# Patient Record
Sex: Female | Born: 1990 | Race: White | Hispanic: No | Smoking: Never smoker
Health system: Southern US, Community
[De-identification: ages and names within clinical notes are randomized; demographics above are authoritative.]

## PROBLEM LIST (undated history)

## (undated) DIAGNOSIS — F4310 Post-traumatic stress disorder, unspecified: Secondary | ICD-10-CM

## (undated) DIAGNOSIS — F429 Obsessive-compulsive disorder, unspecified: Secondary | ICD-10-CM

## (undated) DIAGNOSIS — J45909 Unspecified asthma, uncomplicated: Secondary | ICD-10-CM

## (undated) DIAGNOSIS — R51 Headache: Secondary | ICD-10-CM

## (undated) DIAGNOSIS — F913 Oppositional defiant disorder: Secondary | ICD-10-CM

## (undated) DIAGNOSIS — Z8659 Personal history of other mental and behavioral disorders: Secondary | ICD-10-CM

## (undated) DIAGNOSIS — F32A Depression, unspecified: Secondary | ICD-10-CM

## (undated) DIAGNOSIS — F29 Unspecified psychosis not due to a substance or known physiological condition: Secondary | ICD-10-CM

## (undated) DIAGNOSIS — F319 Bipolar disorder, unspecified: Secondary | ICD-10-CM

## (undated) DIAGNOSIS — N920 Excessive and frequent menstruation with regular cycle: Secondary | ICD-10-CM

## (undated) DIAGNOSIS — F329 Major depressive disorder, single episode, unspecified: Secondary | ICD-10-CM

## (undated) DIAGNOSIS — I499 Cardiac arrhythmia, unspecified: Secondary | ICD-10-CM

## (undated) DIAGNOSIS — F419 Anxiety disorder, unspecified: Secondary | ICD-10-CM

## (undated) DIAGNOSIS — G43109 Migraine with aura, not intractable, without status migrainosus: Secondary | ICD-10-CM

## (undated) HISTORY — DX: Excessive and frequent menstruation with regular cycle: N92.0

## (undated) HISTORY — DX: Major depressive disorder, single episode, unspecified: F32.9

## (undated) HISTORY — DX: Anxiety disorder, unspecified: F41.9

## (undated) HISTORY — DX: Post-traumatic stress disorder, unspecified: F43.10

## (undated) HISTORY — DX: Personal history of other mental and behavioral disorders: Z86.59

## (undated) HISTORY — DX: Migraine with aura, not intractable, without status migrainosus: G43.109

## (undated) HISTORY — DX: Headache: R51

## (undated) HISTORY — DX: Depression, unspecified: F32.A

## (undated) HISTORY — DX: Unspecified psychosis not due to a substance or known physiological condition: F29

## (undated) HISTORY — DX: Cardiac arrhythmia, unspecified: I49.9

## (undated) HISTORY — DX: Obsessive-compulsive disorder, unspecified: F42.9

## (undated) HISTORY — DX: Unspecified asthma, uncomplicated: J45.909

## (undated) HISTORY — DX: Bipolar disorder, unspecified: F31.9

## (undated) HISTORY — DX: Oppositional defiant disorder: F91.3

---
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
## 2009-07-27 ENCOUNTER — Ambulatory Visit (HOSPITAL_COMMUNITY): Admission: RE | Admit: 2009-07-27 | Discharge: 2009-07-27 | Payer: Self-pay | Admitting: Internal Medicine

## 2009-08-25 ENCOUNTER — Emergency Department (HOSPITAL_COMMUNITY): Admission: EM | Admit: 2009-08-25 | Discharge: 2009-08-25 | Payer: Self-pay | Admitting: Emergency Medicine

## 2010-11-29 ENCOUNTER — Other Ambulatory Visit (HOSPITAL_COMMUNITY): Payer: Self-pay | Admitting: Internal Medicine

## 2010-11-29 ENCOUNTER — Ambulatory Visit (HOSPITAL_COMMUNITY)
Admission: RE | Admit: 2010-11-29 | Discharge: 2010-11-29 | Disposition: A | Discharge status: Home / Self Care | Payer: Managed Care, Other (non HMO) | Source: Ambulatory Visit | Attending: Internal Medicine | Admitting: Internal Medicine

## 2010-11-29 DIAGNOSIS — M545 Low back pain, unspecified: Secondary | ICD-10-CM

## 2012-01-30 IMAGING — CR DG LUMBAR SPINE COMPLETE 4+V
5 series · 5 of 5 positions shown · non-contrast
Comparison: None.

CLINICAL DATA: Worsening low back pain.

LUMBAR SPINE - COMPLETE 4+ VIEW

[view not recorded (1 of 5)]
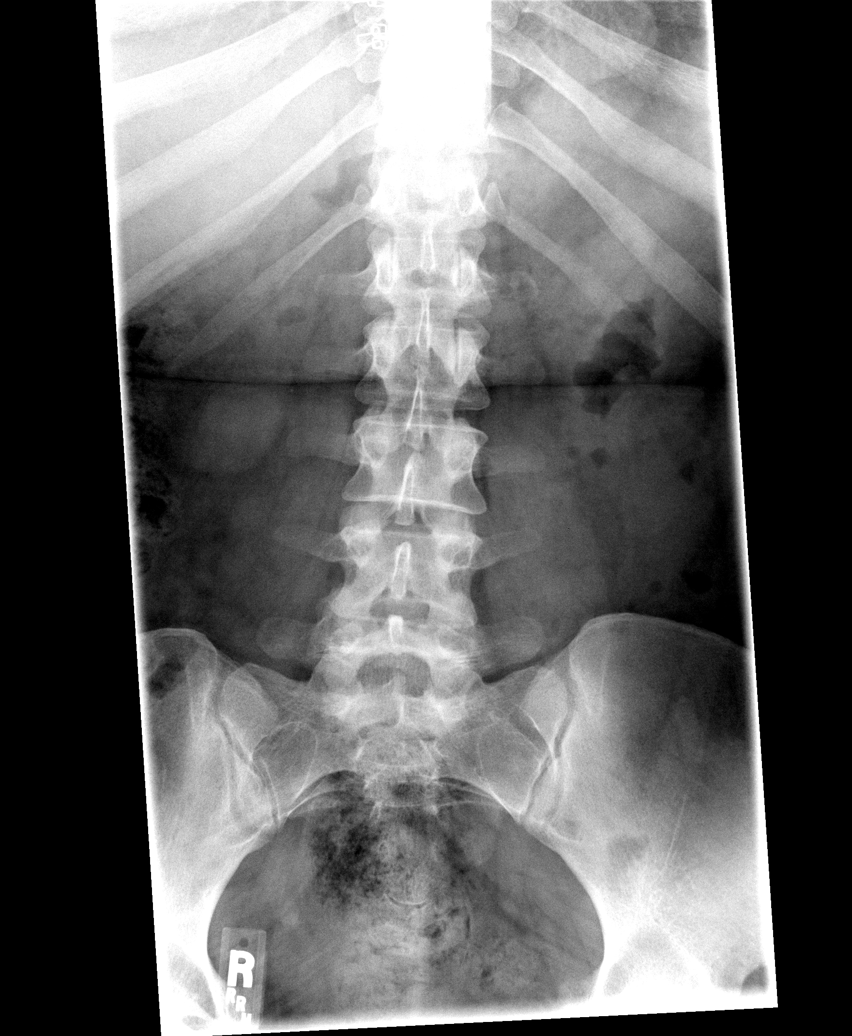

[view not recorded (2 of 5)]
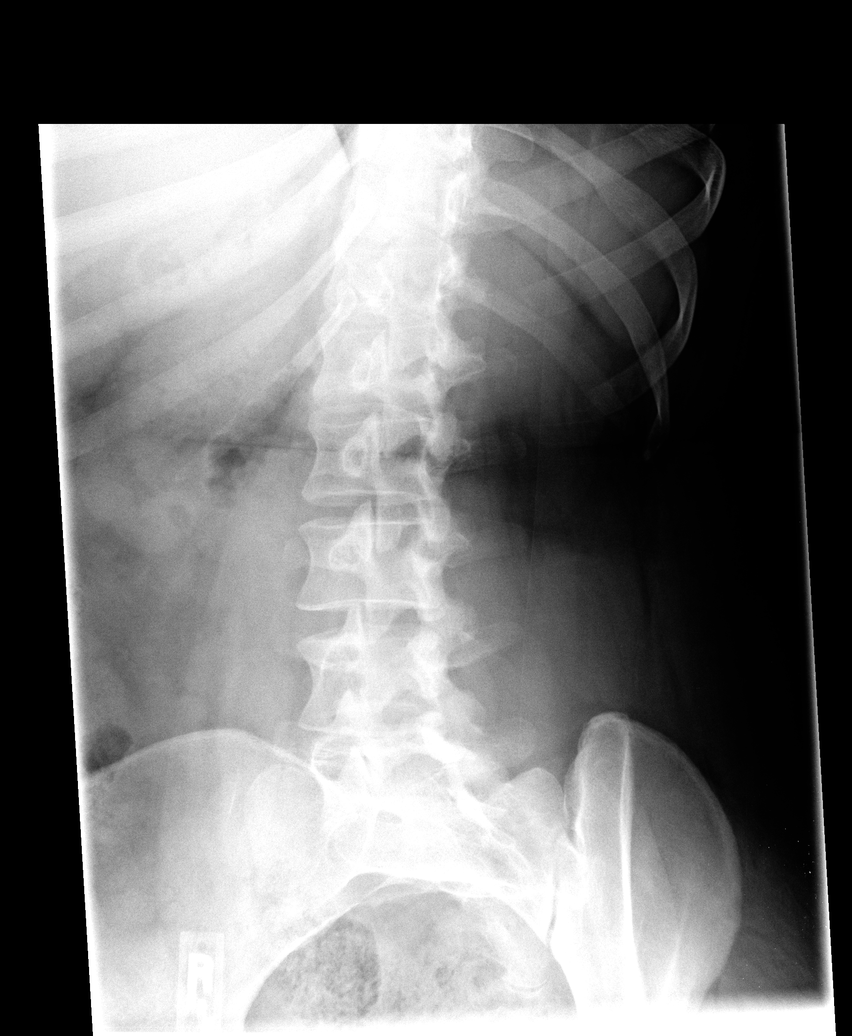

[view not recorded (3 of 5)]
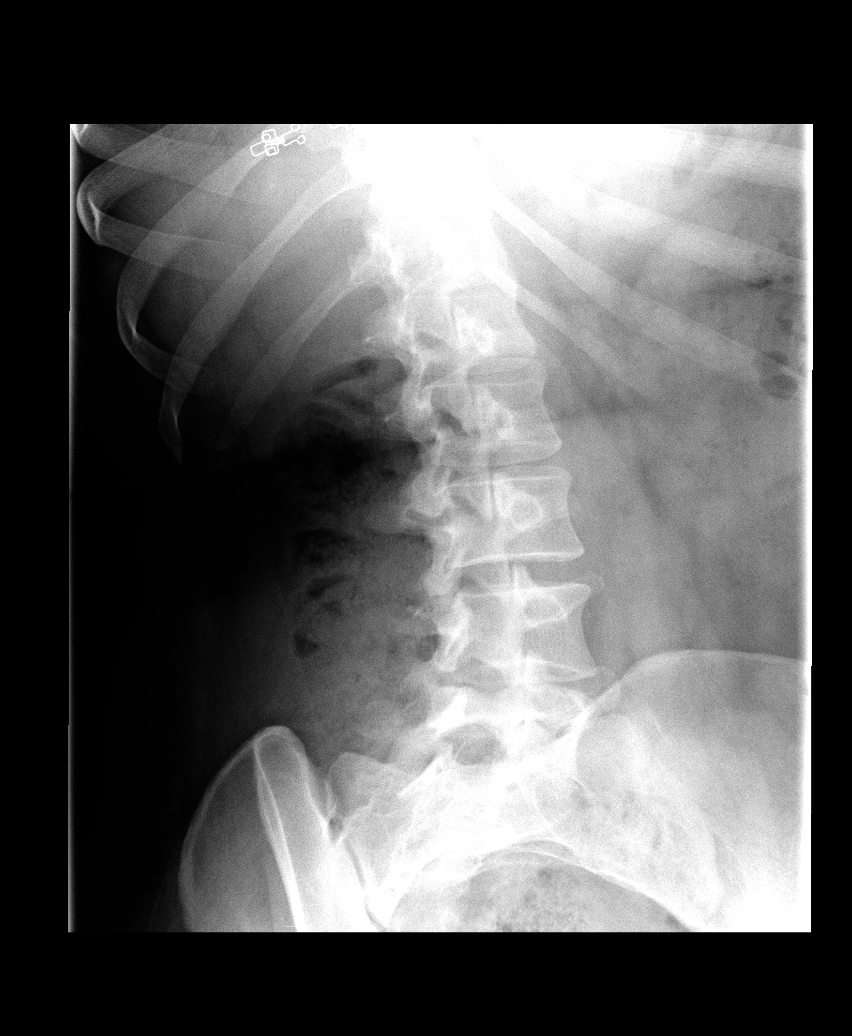

[view not recorded (4 of 5)]
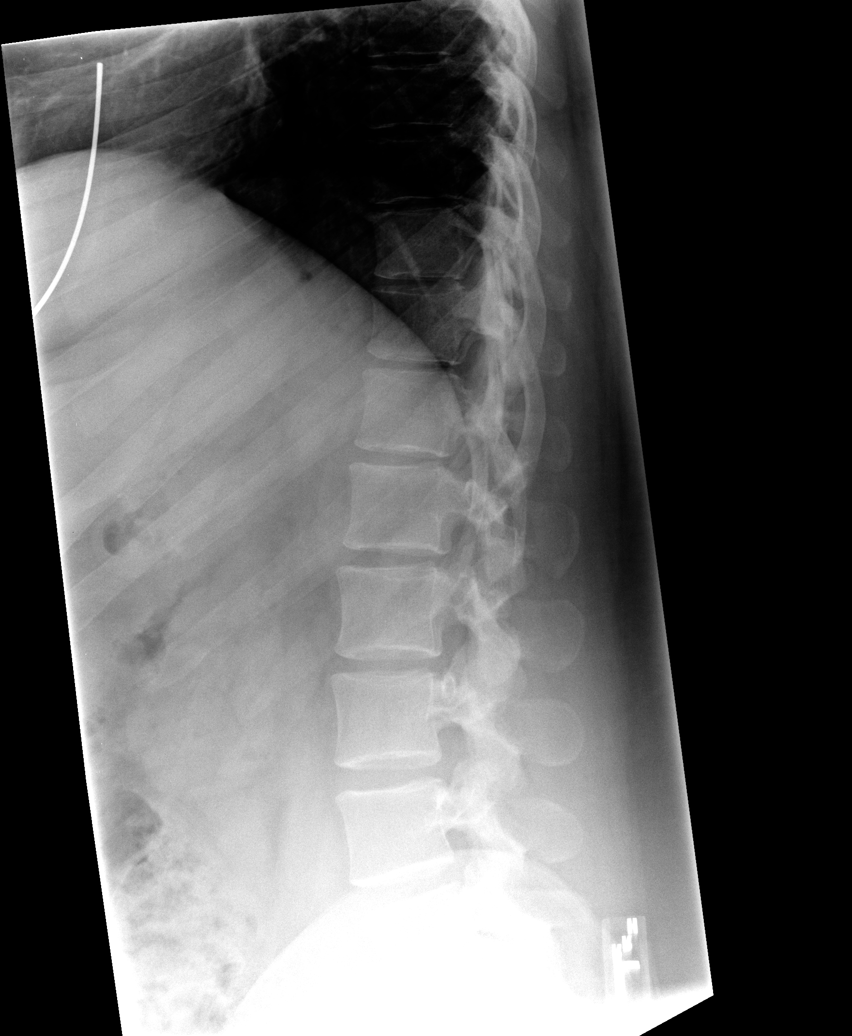

[view not recorded (5 of 5)]
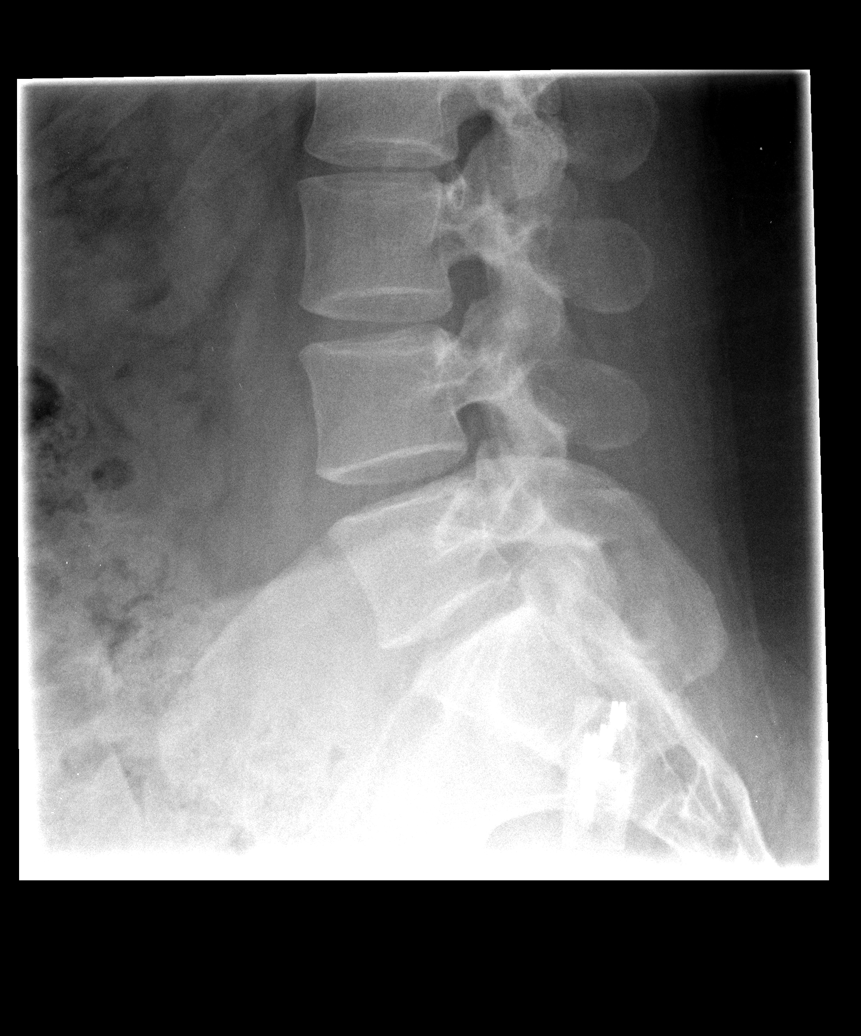

[5 of 5 positions shown; findings below may reference images not displayed]

FINDINGS: There is no evidence of lumbar spine fracture.
Alignment is normal.  Intervertebral disc spaces are maintained.
IMPRESSION: Negative.

## 2012-11-16 ENCOUNTER — Ambulatory Visit (INDEPENDENT_AMBULATORY_CARE_PROVIDER_SITE_OTHER): Payer: Managed Care, Other (non HMO) | Admitting: Psychiatry

## 2012-11-16 ENCOUNTER — Encounter (HOSPITAL_COMMUNITY): Payer: Self-pay | Admitting: Psychiatry

## 2012-11-16 VITALS — Wt 241.2 lb

## 2012-11-16 DIAGNOSIS — F410 Panic disorder [episodic paroxysmal anxiety] without agoraphobia: Secondary | ICD-10-CM

## 2012-11-16 DIAGNOSIS — F314 Bipolar disorder, current episode depressed, severe, without psychotic features: Secondary | ICD-10-CM | POA: Insufficient documentation

## 2012-11-16 DIAGNOSIS — F411 Generalized anxiety disorder: Secondary | ICD-10-CM

## 2012-11-16 DIAGNOSIS — F3130 Bipolar disorder, current episode depressed, mild or moderate severity, unspecified: Secondary | ICD-10-CM

## 2012-11-16 DIAGNOSIS — F4310 Post-traumatic stress disorder, unspecified: Secondary | ICD-10-CM

## 2012-11-16 DIAGNOSIS — F429 Obsessive-compulsive disorder, unspecified: Secondary | ICD-10-CM

## 2012-11-16 NOTE — Patient Instructions (Addendum)
"  The [place] to [place]" compiled by [employer] could be helpful.  Strongly consider attending at least 6 Alanon Meetings to help you learn about how your helping others to the exclusion of helping yourself is actually hurting yourself and is actually an addiction to fixing others and that you need to work the 12 Step to Happiness through the [employer]. Al-Anon Family Groups could be helpful with how to deal with substance abusing family and friends. Or your own issues of being in victim role.  There are only 40 Alanon Family Group meetings a week here in [employer].  Online are current listing of those meetings @ [email or web address]  There are [employer].  Search on line and there you can learn the format and can access the schedule for yourself.  Their number is [phone]  Adult children of alcoholics has some amazing literature available on line.  The work book is awesome  Yoga is a very helpful exercise method.  On TV, on line, or by DVD Adelfa Koh is a source of high quality information about yoga and videos on yoga.  Renee Ramus is the world's number one video yoga instructor according to some experts.  There are exceptional health benefits that can be achieved through yoga.  The main principles of yoga is acceptance, no competition, no comparison, and no judgement.  It is exceptional in helping people meditate and get to a very relaxed state.   "I am Wishes Fulfilled Meditation" by Marylene Buerger and Lyndal Pulley may be helpful for meditation  CUT BACK/CUT OUT on sugar and carbohydrates, that means very limited fruits and starchy vegetables and very limited grains, breads  The goal is low GLYCEMIC INDEX.  CUT OUT all wheat, rye, or barley for the GLUTEN in them.  HIGH fat and LOW carbohydrate diet is the KEY.  Eat avocados, eggs, lean meat like grass fed beef and chicken  Nuts and seeds would be good foods as well.   Stevia is an excellent  sweetener.  Safe for the brain.   Lowella Grip is also a good safe sweetener, not the baking blend form of Truvia  Almond butter is awesome.  Check out all this on the Internet.  Dr Heber Vieques is on the Internet with some good info about this.   Http://www.drperlmutter.com is where that is.  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 These 3 things are the foundation of rehabilitating your brain. Staying off all abusable substances including nicotine, caffeine, and refined sugar and avoiding further head injuries are the other important elements in helping you keep your brain working the best it can for you. If memory is a problem then INSTEAD of the fish oil mentioned above, try using Brain Power Basics from [employer].  You can order online or by phone [phone]. It costs $99 for the first month, and $80 monthly thereafter, but that investment in your brain and the recovery of your brain proper functioning would seem worth it.  Call if problems or concerns.
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
## 2012-11-17 ENCOUNTER — Other Ambulatory Visit: Payer: Self-pay | Admitting: Adult Health

## 2012-11-17 ENCOUNTER — Telehealth (HOSPITAL_COMMUNITY): Payer: Self-pay | Admitting: Psychiatry

## 2012-11-17 ENCOUNTER — Other Ambulatory Visit (HOSPITAL_COMMUNITY)
Admission: RE | Admit: 2012-11-17 | Discharge: 2012-11-17 | Disposition: A | Discharge status: Home / Self Care | Payer: Managed Care, Other (non HMO) | Source: Ambulatory Visit | Attending: Obstetrics and Gynecology | Admitting: Obstetrics and Gynecology

## 2012-11-17 ENCOUNTER — Ambulatory Visit (INDEPENDENT_AMBULATORY_CARE_PROVIDER_SITE_OTHER): Payer: Managed Care, Other (non HMO) | Admitting: Adult Health

## 2012-11-17 ENCOUNTER — Encounter: Payer: Self-pay | Admitting: Adult Health

## 2012-11-17 VITALS — BP 118/80 | Ht 60.0 in | Wt 241.0 lb

## 2012-11-17 DIAGNOSIS — Z0000 Encounter for general adult medical examination without abnormal findings: Secondary | ICD-10-CM

## 2012-11-17 DIAGNOSIS — R51 Headache: Secondary | ICD-10-CM

## 2012-11-17 DIAGNOSIS — F410 Panic disorder [episodic paroxysmal anxiety] without agoraphobia: Secondary | ICD-10-CM

## 2012-11-17 DIAGNOSIS — Z01419 Encounter for gynecological examination (general) (routine) without abnormal findings: Secondary | ICD-10-CM | POA: Insufficient documentation

## 2012-11-17 DIAGNOSIS — F314 Bipolar disorder, current episode depressed, severe, without psychotic features: Secondary | ICD-10-CM

## 2012-11-17 DIAGNOSIS — N946 Dysmenorrhea, unspecified: Secondary | ICD-10-CM | POA: Insufficient documentation

## 2012-11-17 DIAGNOSIS — G43109 Migraine with aura, not intractable, without status migrainosus: Secondary | ICD-10-CM

## 2012-11-17 DIAGNOSIS — N920 Excessive and frequent menstruation with regular cycle: Secondary | ICD-10-CM | POA: Insufficient documentation

## 2012-11-17 DIAGNOSIS — F4310 Post-traumatic stress disorder, unspecified: Secondary | ICD-10-CM

## 2012-11-17 DIAGNOSIS — F5105 Insomnia due to other mental disorder: Secondary | ICD-10-CM | POA: Insufficient documentation

## 2012-11-17 HISTORY — DX: Migraine with aura, not intractable, without status migrainosus: G43.109

## 2012-11-17 MED ORDER — NAPROXEN 500 MG PO TABS: 500.0000 mg | ORAL_TABLET | Freq: Two times a day (BID) | ORAL | Status: DC | PRN

## 2012-11-17 MED ORDER — QUETIAPINE FUMARATE 25 MG PO TABS: 25.0000 mg | ORAL_TABLET | Freq: Every day | ORAL | Status: DC

## 2012-11-17 MED ORDER — LITHIUM CARBONATE 600 MG PO CAPS: 600.0000 mg | ORAL_CAPSULE | Freq: Every day | ORAL | Status: DC

## 2012-11-17 MED ORDER — LITHIUM CARBONATE 300 MG PO TABS: 300.0000 mg | ORAL_TABLET | ORAL | Status: DC

## 2012-11-17 MED ORDER — SERTRALINE HCL 50 MG PO TABS: 50.0000 mg | ORAL_TABLET | Freq: Every day | ORAL | Status: DC

## 2012-11-17 MED ORDER — NORETHINDRONE 0.35 MG PO TABS: 1.0000 | ORAL_TABLET | Freq: Every day | ORAL | Status: DC

## 2012-11-17 NOTE — Progress Notes (Signed)
Subjective:     Patient ID: Paula Kane, female   DOB: Sep 01, 1991, 22 y.o.   MRN: [id number]  HPI [name] is a 22 year old white female in today for Pap and physical.   Review of Systems patient is complaining of menstrual-type headaches she does have auras and no shortness of breath, chest pain, abdominal pain, problems with bowel movements, urination and she is currently not sexually active she is complaining of heavy painful periods and would like to get on some form of birth control.     Objective:   Physical Exam vital signs blood pressure is 118/80 her pulse was 78 and regular her height is 5 foot her weight is 241lbs. skin is warm and dry, neck midline trachea normal thyroid, lungs clear to auscultation bilaterally, breasts no dominant palpable mass retraction or nipple discharge, abdomen soft nontender no hepatosplenomegaly was appreciated she is morbidly obese, on pelvic exam external genitalia is normal in appearance, the vagina is normal appearance except she does have some period-type blood in the vault the cervix is nulliparous, negative cervical motion tenderness uterus is felt to be normal size shape and contour no adnexal masses noted she does have some tenderness in the right lower quadrant. It is noteworthy she does have some acne on her face chest and back and appears to be some hidradenitis under the breast and groin area. Pap smear was performed with a reflex HPV.     Assessment:     Yearly exam, contraceptive management, menorrhagia, dysmenorrhea, history of bipolar disorder and depression    Plan:     The patient was prescribed Micronor which she can start to day, she is encouraged to use condoms if she becomes sexually active, also she was given a prescription for naproxen to see if it helps with the dysmenorrhea will followup in the year for physical when necessary problems.

## 2012-11-17 NOTE — Patient Instructions (Addendum)
Follow up in 1 year, start birth control pills today, if has sex use condoms, call any problems.

## 2012-11-17 NOTE — Telephone Encounter (Signed)
Nicolette Bang indicated to the patient that they did not receive the eScript orders.  Called [facility] and placed on hold for more than the time it took to rewrite and resend scripts.  Scripts sent again by eScript. Sent a bogus script for 0.00003 pills of Lithium and requested that they fax confirmation of receipt of 4 scripts.

## 2012-11-18 ENCOUNTER — Telehealth (HOSPITAL_COMMUNITY): Payer: Self-pay | Admitting: Psychiatry

## 2012-11-18 DIAGNOSIS — F4310 Post-traumatic stress disorder, unspecified: Secondary | ICD-10-CM

## 2012-11-18 DIAGNOSIS — F314 Bipolar disorder, current episode depressed, severe, without psychotic features: Secondary | ICD-10-CM

## 2012-11-18 DIAGNOSIS — Z79899 Other long term (current) drug therapy: Secondary | ICD-10-CM

## 2012-11-18 DIAGNOSIS — F5105 Insomnia due to other mental disorder: Secondary | ICD-10-CM

## 2012-11-18 DIAGNOSIS — F411 Generalized anxiety disorder: Secondary | ICD-10-CM

## 2012-11-18 MED ORDER — CARBAMAZEPINE ER 100 MG PO TB12: 100.0000 mg | ORAL_TABLET | Freq: Two times a day (BID) | ORAL | Status: DC

## 2012-11-18 NOTE — Telephone Encounter (Signed)
Tegretol discussed and ordered.  Will leave order for level with therapist.

## 2012-11-18 NOTE — Addendum Note (Signed)
Addended by: Mike Craze on: 11/18/2012 04:56 PM   Modules accepted: Orders

## 2012-11-18 NOTE — Telephone Encounter (Signed)
L/M on Paula Kane's cell phone indicating that I didn't remember what we thought about trying for anxiety.  Invited her to call me back.  Asked if I had mentioned Tegretol for that.

## 2012-11-22 ENCOUNTER — Telehealth (HOSPITAL_COMMUNITY): Payer: Self-pay | Admitting: Psychiatry

## 2012-11-22 DIAGNOSIS — F411 Generalized anxiety disorder: Secondary | ICD-10-CM

## 2012-11-22 DIAGNOSIS — F4310 Post-traumatic stress disorder, unspecified: Secondary | ICD-10-CM

## 2012-11-22 DIAGNOSIS — F5105 Insomnia due to other mental disorder: Secondary | ICD-10-CM

## 2012-11-24 MED ORDER — HYDROXYZINE HCL 25 MG PO TABS: 25.0000 mg | ORAL_TABLET | Freq: Three times a day (TID) | ORAL | Status: DC | PRN

## 2012-11-24 NOTE — Telephone Encounter (Signed)
Vistaril 25 written for for anxiety, itching and insomnia sent by eScript.

## 2012-11-29 ENCOUNTER — Ambulatory Visit (INDEPENDENT_AMBULATORY_CARE_PROVIDER_SITE_OTHER): Payer: Managed Care, Other (non HMO) | Admitting: Psychiatry

## 2012-11-29 DIAGNOSIS — F3130 Bipolar disorder, current episode depressed, mild or moderate severity, unspecified: Secondary | ICD-10-CM

## 2012-11-30 LAB — COMPREHENSIVE METABOLIC PANEL
ALT: 34 U/L (ref 0–35)
AST: 22 U/L (ref 0–37)
Albumin: 4.3 g/dL (ref 3.5–5.2)
Alkaline Phosphatase: 83 U/L (ref 39–117)
BUN: 10 mg/dL (ref 6–23)
CO2: 29 mEq/L (ref 19–32)
Calcium: 9.8 mg/dL (ref 8.4–10.5)
Chloride: 102 mEq/L (ref 96–112)
Creat: 0.66 mg/dL (ref 0.50–1.10)
Glucose, Bld: 81 mg/dL (ref 70–99)
Potassium: 4 mEq/L (ref 3.5–5.3)
Sodium: 141 mEq/L (ref 135–145)
Total Bilirubin: 0.5 mg/dL (ref 0.3–1.2)
Total Protein: 7.4 g/dL (ref 6.0–8.3)

## 2012-11-30 LAB — CBC WITH DIFFERENTIAL/PLATELET
Basophils Absolute: 0 10*3/uL (ref 0.0–0.1)
Basophils Relative: 0 % (ref 0–1)
Eosinophils Absolute: 0.3 10*3/uL (ref 0.0–0.7)
Eosinophils Relative: 2 % (ref 0–5)
HCT: 40.1 % (ref 36.0–46.0)
Hemoglobin: 13.2 g/dL (ref 12.0–15.0)
Lymphocytes Relative: 18 % (ref 12–46)
Lymphs Abs: 2 10*3/uL (ref 0.7–4.0)
MCH: 28.8 pg (ref 26.0–34.0)
MCHC: 32.9 g/dL (ref 30.0–36.0)
MCV: 87.4 fL (ref 78.0–100.0)
Monocytes Absolute: 0.7 10*3/uL (ref 0.1–1.0)
Monocytes Relative: 7 % (ref 3–12)
Neutro Abs: 8 10*3/uL — ABNORMAL HIGH (ref 1.7–7.7)
Neutrophils Relative %: 73 % (ref 43–77)
Platelets: 352 10*3/uL (ref 150–400)
RBC: 4.59 MIL/uL (ref 3.87–5.11)
WBC: 11 10*3/uL — ABNORMAL HIGH (ref 4.0–10.5)

## 2012-11-30 LAB — CARBAMAZEPINE LEVEL, TOTAL: Carbamazepine Lvl: 4.5 ug/mL (ref 4.0–12.0)

## 2012-12-01 NOTE — Telephone Encounter (Signed)
LM on answering machine that identified the owner as Paula Kane.  Indicated that the labs are back and that the CBC was essentially typical and that the comprehensive metabolic b=panel was entirely within normal limits and that the Tegretol level was 4.5 range 4 - 12 and that if everything is okay, then I will see at the next visti otehrwise invited her to call back if any questions or problems.

## 2012-12-06 NOTE — Telephone Encounter (Signed)
Called Home phone number and a female answered the phone.   He reported that pt was not available at this time and offered to take a message.  I relayed that her Tamera Reason level was in range but there was room to increase the dose.  I invited her to call me back if there were problems or questions.  I inquired as to relationship with pt and he indicated that he was her step dad. Second message about these labs.

## 2012-12-07 NOTE — Patient Instructions (Signed)
Discussed orally 
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
## 2012-12-08 ENCOUNTER — Ambulatory Visit (INDEPENDENT_AMBULATORY_CARE_PROVIDER_SITE_OTHER): Payer: Managed Care, Other (non HMO) | Admitting: Psychiatry

## 2012-12-08 DIAGNOSIS — F3130 Bipolar disorder, current episode depressed, mild or moderate severity, unspecified: Secondary | ICD-10-CM
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
## 2012-12-11 ENCOUNTER — Emergency Department (HOSPITAL_COMMUNITY)
Admission: EM | Admit: 2012-12-11 | Discharge: 2012-12-11 | Disposition: A | Discharge status: Home / Self Care | Payer: Managed Care, Other (non HMO) | Attending: Emergency Medicine | Admitting: Emergency Medicine

## 2012-12-11 ENCOUNTER — Encounter (HOSPITAL_COMMUNITY): Payer: Self-pay | Admitting: *Deleted

## 2012-12-11 DIAGNOSIS — R209 Unspecified disturbances of skin sensation: Secondary | ICD-10-CM | POA: Insufficient documentation

## 2012-12-11 DIAGNOSIS — Z8679 Personal history of other diseases of the circulatory system: Secondary | ICD-10-CM | POA: Insufficient documentation

## 2012-12-11 DIAGNOSIS — F4310 Post-traumatic stress disorder, unspecified: Secondary | ICD-10-CM | POA: Insufficient documentation

## 2012-12-11 DIAGNOSIS — F411 Generalized anxiety disorder: Secondary | ICD-10-CM | POA: Insufficient documentation

## 2012-12-11 DIAGNOSIS — F319 Bipolar disorder, unspecified: Secondary | ICD-10-CM | POA: Insufficient documentation

## 2012-12-11 DIAGNOSIS — R259 Unspecified abnormal involuntary movements: Secondary | ICD-10-CM | POA: Insufficient documentation

## 2012-12-11 DIAGNOSIS — R110 Nausea: Secondary | ICD-10-CM | POA: Insufficient documentation

## 2012-12-11 DIAGNOSIS — F429 Obsessive-compulsive disorder, unspecified: Secondary | ICD-10-CM | POA: Insufficient documentation

## 2012-12-11 DIAGNOSIS — F603 Borderline personality disorder: Secondary | ICD-10-CM | POA: Insufficient documentation

## 2012-12-11 DIAGNOSIS — R42 Dizziness and giddiness: Secondary | ICD-10-CM | POA: Insufficient documentation

## 2012-12-11 DIAGNOSIS — J45909 Unspecified asthma, uncomplicated: Secondary | ICD-10-CM | POA: Insufficient documentation

## 2012-12-11 DIAGNOSIS — Z79899 Other long term (current) drug therapy: Secondary | ICD-10-CM | POA: Insufficient documentation

## 2012-12-11 LAB — BASIC METABOLIC PANEL
Chloride: 103 mEq/L (ref 96–112)
Creatinine, Ser: 0.6 mg/dL (ref 0.50–1.10)
GFR calc Af Amer: >90 mL/min (ref >90)

## 2012-12-11 LAB — LITHIUM LEVEL: Lithium Lvl: 0.71 mEq/L — ABNORMAL LOW (ref 0.80–1.40)

## 2012-12-11 LAB — CARBAMAZEPINE LEVEL, TOTAL: Carbamazepine Lvl: 1.8 ug/mL — ABNORMAL LOW (ref 4.0–12.0)

## 2012-12-11 LAB — CBC
MCH: 31.4 pg (ref 26.0–34.0)
Platelets: 253 10*3/uL (ref 150–400)
RBC: 4.01 MIL/uL (ref 3.87–5.11)
WBC: 7.3 10*3/uL (ref 4.0–10.5)

## 2012-12-11 MED ORDER — ONDANSETRON 8 MG PO TBDP
8.0000 mg | ORAL_TABLET | Freq: Once | ORAL | Status: AC
  Administered 2012-12-11: 8 mg via ORAL
  Filled 2012-12-11: qty 1

## 2012-12-11 MED ORDER — ONDANSETRON 8 MG PO TBDP: 8.0000 mg | ORAL_TABLET | Freq: Three times a day (TID) | ORAL | Status: DC | PRN

## 2012-12-11 NOTE — ED Notes (Signed)
Pt c/o nausea for the past three days, abd pain that pt states "is no worse than usual", admits to visual hallucinations for the past two days, pt denies any SI/HI.

## 2012-12-11 NOTE — ED Provider Notes (Signed)
History  This chart was scribed for Paula Co, MD by Ardeen Jourdain, ED Scribe. This patient was seen in room APA03/APA03 and the patient's care was started at 1307.  CSN: [id number]  Arrival date & time 12/11/12  1217   First MD Initiated Contact with Patient 12/11/12 1307      Chief Complaint  Patient presents with  . Hallucinations  . Nausea  . Shaking     The history is provided by the patient and a parent. No language interpreter was used.    Paula Kane is a 22 y.o. female with a h/o PTSD, bipolar 1 disorder, ODD, OCD and borderline personality disorder who presents to the Emergency Department complaining of gradual onset, gradually worsening, constant nausea, shaking, tingling and visual hallucinations. Pt states her arms are tingling. Pt states she feels like the "world is tilting" when she closes her eyes. She states she is also dizzy with her eyes are open. She states she feels like she is dissociated with her body. Pt states her psychiatrist recently changed her medication. She states she was prescribed tegretol 1 month ago. She denies any new weakness in her arms and legs.    Past Medical History  Diagnosis Date  . Anxiety   . Arrhythmia   . Asthma   . Bipolar disorder   . History of borderline personality disorder   . Depression   . Headache   . Obsessive-compulsive disorder   . Oppositional defiant disorder   . PTSD (post-traumatic stress disorder)   . Psychosis   . Migraine headache with aura 11/17/2012    Associated with menses     History reviewed. No pertinent past surgical history.  Family History  Problem Relation Age of Onset  . Anxiety disorder Mother   . OCD Mother   . Sexual abuse Mother   . Physical abuse Mother   . Multiple sclerosis Mother   . Alcohol abuse Father   . Drug abuse Father   . Paranoid behavior Father   . ADD / ADHD Sister   . Depression Maternal Aunt     MGA  . Dementia Maternal Grandfather     MGGF  . Anxiety  disorder Maternal Grandmother   . Bipolar disorder Maternal Grandmother   . Alcohol abuse Paternal Grandfather   . Dementia Paternal Grandmother     PGGM  . Bipolar disorder Cousin   . Schizophrenia Cousin   . Seizures Maternal Aunt     History  Substance Use Topics  . Smoking status: Passive Smoke Exposure - Never Smoker    Types: Cigarettes  . Smokeless tobacco: Never Used  . Alcohol Use: Yes     Comment: occ.   No Ob history available.   Review of Systems  Constitutional: Negative for fever and chills.  Respiratory: Negative for shortness of breath.   Gastrointestinal: Positive for nausea. Negative for vomiting.  Neurological: Positive for dizziness. Negative for weakness.  Psychiatric/Behavioral: Positive for hallucinations. The patient is nervous/anxious.   All other systems reviewed and are negative.    Allergies  Hydrocodone; Sulfa antibiotics; Codeine; Lactose intolerance (gi); Lexapro; and Soap  Home Medications   Current Outpatient Rx  Name  Route  Sig  Dispense  Refill  . calcium-vitamin D (OSCAL WITH D) 250-125 MG-UNIT per tablet   Oral   Take 1 tablet by mouth 2 (two) times daily.         . carbamazepine (TEGRETOL-XR) 100 MG 12 hr tablet   Oral  Take 1 tablet (100 mg total) by mouth 2 (two) times daily.   60 tablet   2   . fexofenadine-pseudoephedrine (ALLEGRA-D 24) 180-240 MG per 24 hr tablet   Oral   Take 1 tablet by mouth daily.         . fish oil-omega-3 fatty acids 1000 MG capsule   Oral   Take 1 g by mouth 3 (three) times daily.         . hydrOXYzine (ATARAX/VISTARIL) 25 MG tablet   Oral   Take 1 tablet (25 mg total) by mouth every 8 (eight) hours as needed for itching or anxiety (insomnia).   60 tablet   1   . lithium 300 MG tablet   Oral   Take 1 tablet (300 mg total) by mouth every morning.   30 tablet   1   . lithium 600 MG capsule   Oral   Take 1 capsule (600 mg total) by mouth at bedtime.   30 capsule   1   .  Multiple Vitamin (MULTIVITAMIN) tablet   Oral   Take 1 tablet by mouth daily.         . naproxen (NAPROSYN) 500 MG tablet   Oral   Take 1 tablet (500 mg total) by mouth 2 (two) times daily as needed (Only when on Period).   40 tablet   1   . norethindrone (MICRONOR,CAMILA,ERRIN) 0.35 MG tablet   Oral   Take 1 tablet (0.35 mg total) by mouth daily.   1 Package   11   . QUEtiapine (SEROQUEL) 25 MG tablet   Oral   Take 1 tablet (25 mg total) by mouth at bedtime.   30 tablet   1   . sertraline (ZOLOFT) 50 MG tablet   Oral   Take 1 tablet (50 mg total) by mouth daily.   30 tablet   1     Triage Vitals: BP 132/82  Pulse 94  Temp(Src) 98.8 F (37.1 C) (Oral)  Resp 16  Ht 5' (1.524 m)  Wt 241 lb (109.317 kg)  BMI 47.07 kg/m2  SpO2 100%  LMP 12/07/2012  Physical Exam  Nursing note and vitals reviewed. Constitutional: She is oriented to person, place, and time. She appears well-developed and well-nourished. No distress.  HENT:  Head: Normocephalic and atraumatic.  Eyes: EOM are normal. Pupils are equal, round, and reactive to light.  Neck: Normal range of motion.  Cardiovascular: Normal rate, regular rhythm and normal heart sounds.   Pulmonary/Chest: Effort normal and breath sounds normal.  Abdominal: Soft. She exhibits no distension. There is no tenderness.  Musculoskeletal: Normal range of motion.  Neurological: She is alert and oriented to person, place, and time.  Strength equal in bilateral upper and lower extremity major muscle groups  Skin: Skin is warm and dry. She is not diaphoretic.  Psychiatric: She has a normal mood and affect. Her behavior is normal. Judgment normal. Her speech is rapid and/or pressured. Cognition and memory are normal. She expresses no suicidal plans and no homicidal plans.    ED Course  Procedures (including critical care time)  DIAGNOSTIC STUDIES: Oxygen Saturation is 100% on room air, normal by my interpretation.     COORDINATION OF CARE:  1:32 PM-Discussed treatment plan which includes lithium, CBC, BMP and carbamazepine with pt at bedside and pt agreed to plan.    Labs Reviewed  LITHIUM LEVEL - Abnormal; Notable for the following:    Lithium Lvl 0.71 (*)  All other components within normal limits  CBC - Abnormal; Notable for the following:    HCT 34.9 (*)    MCHC 36.1 (*)    All other components within normal limits  CARBAMAZEPINE LEVEL, TOTAL - Abnormal; Notable for the following:    Carbamazepine Lvl 1.8 (*)    All other components within normal limits  BASIC METABOLIC PANEL - Abnormal; Notable for the following:    Potassium 3.3 (*)    All other components within normal limits   No results found.   1. Dizziness   2. Bipolar 1 disorder       MDM  Some the symptoms seem more like peripheral vertigo.  Home with nausea medicine.  She has no acute psychiatric issues that would warrant admission to a psychiatric hospital.  She is very educated and has very good insight.  Her family is with her and seems very close and supportive.  Family is also in agreement with close outpatient psychiatric followup.  Tegretol and lithium levels are not elevated.  No central vertigo process present.  No indication for advanced imaging.  Discharge home in good condition.  Patient is very well appearing      I personally performed the services described in this documentation, which was scribed in my presence. The recorded information has been reviewed and is accurate.      Paula Co, MD 12/11/12 [phone]

## 2012-12-11 NOTE — ED Notes (Signed)
Nausea, shaking, and visual hallucinations. Also states that she feels like she is tilting when she closes her eyes. Pt is pleasant, smiling, hugging power ranger doll.

## 2012-12-14 ENCOUNTER — Encounter (HOSPITAL_COMMUNITY): Payer: Self-pay | Admitting: Psychiatry

## 2012-12-14 ENCOUNTER — Ambulatory Visit (INDEPENDENT_AMBULATORY_CARE_PROVIDER_SITE_OTHER): Payer: Managed Care, Other (non HMO) | Admitting: Psychiatry

## 2012-12-14 VITALS — Wt 238.8 lb

## 2012-12-14 DIAGNOSIS — F5105 Insomnia due to other mental disorder: Secondary | ICD-10-CM

## 2012-12-14 DIAGNOSIS — F4310 Post-traumatic stress disorder, unspecified: Secondary | ICD-10-CM

## 2012-12-14 DIAGNOSIS — F314 Bipolar disorder, current episode depressed, severe, without psychotic features: Secondary | ICD-10-CM

## 2012-12-14 DIAGNOSIS — R110 Nausea: Secondary | ICD-10-CM | POA: Insufficient documentation

## 2012-12-14 DIAGNOSIS — F411 Generalized anxiety disorder: Secondary | ICD-10-CM

## 2012-12-14 DIAGNOSIS — F429 Obsessive-compulsive disorder, unspecified: Secondary | ICD-10-CM

## 2012-12-14 DIAGNOSIS — F410 Panic disorder [episodic paroxysmal anxiety] without agoraphobia: Secondary | ICD-10-CM

## 2012-12-14 MED ORDER — CARBAMAZEPINE ER 100 MG PO TB12: 100.0000 mg | ORAL_TABLET | Freq: Two times a day (BID) | ORAL | Status: DC

## 2012-12-14 MED ORDER — ONDANSETRON 8 MG PO TBDP: 8.0000 mg | ORAL_TABLET | Freq: Three times a day (TID) | ORAL | Status: DC | PRN

## 2012-12-14 MED ORDER — QUETIAPINE FUMARATE 25 MG PO TABS: 25.0000 mg | ORAL_TABLET | Freq: Every day | ORAL | Status: DC

## 2012-12-14 NOTE — Patient Instructions (Signed)
Set a timer for 8 minutes and walk for that amount of time in the house or in the yard.  Mark "8" on a calendar for that day.  Do that every day this week.  Then next week increase the time to 9 minutes and then mark the calendar with a 9 for that day.  Each week increase your exercise by one minute.  Keep a record of this so you can see what progress you are making.  Do this every day, just like eating and sleeping.  It is good for pain control, depression, and for your soul/spirit.  Bring the record in for your next visit so we can talk about your effort and how you feel with the new exercise program going and working for you.  Relaxation is the ultimate solution for you.  You can seek it through tub baths, bubble baths, essential oils or incense, walking or chatting with friends, listening to soft music, watching a candle burn and just letting all thoughts go and appreciating the true essence of the Creator.  Pets or animals may be very helpful.  You might spend some time with them and then go do more directed meditation.  Yoga is a very helpful exercise method.  On TV, on line, or by DVD Adelfa Koh is a source of high quality information about yoga and videos on yoga.  Renee Ramus is the world's number one video yoga instructor according to some experts.  There are exceptional health benefits that can be achieved through yoga.  The main principles of yoga is acceptance, no competition, no comparison, and no judgement.  It is exceptional in helping people meditate and get to a very relaxed state.   "I am Wishes Fulfilled Meditation" by Marylene Buerger and Lyndal Pulley may be helpful MUSIC for getting to sleep or for meditating You can order it from on line.  You might find the Chill channel on Pandora and explore the artists that you like better.   Strongly consider attending at least 6 Alanon Meetings to help you learn about how your helping others to the exclusion of helping yourself is actually hurting  yourself and is actually an addiction to fixing others and that you need to work the 12 Step to Happiness through the [employer]. Al-Anon Family Groups could be helpful with how to deal with substance abusing family and friends. Or your own issues of being in victim role.  There are only 40 Alanon Family Group meetings a week here in [place].  Online are current listing of those meetings @ [email or web address]/meetings.html  There are [employer].  Search on line and there you can learn the format and can access the schedule for yourself.  Their number is [phone]  "Native Wisdom for White Minds" by Camelia Phenes is a book that could be very helpful.  Some have gotten it on [employer].com for very low cost.  Take care of yourself.  No one else is standing up to do the job and only you know what you need.   GET SERIOUS about taking care of yourself.  Do the next right thing and that often means doing something to care for yourself along the lines of are you hungry, are you angry, are you lonely, are you tired, are you scared?  [employer] is what that stands for.  Call if problems or concerns.
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
## 2012-12-15 ENCOUNTER — Telehealth (HOSPITAL_COMMUNITY): Payer: Self-pay | Admitting: Psychiatry

## 2012-12-15 DIAGNOSIS — R251 Tremor, unspecified: Secondary | ICD-10-CM | POA: Insufficient documentation

## 2012-12-15 MED ORDER — PROPRANOLOL HCL 10 MG PO TABS: 10.0000 mg | ORAL_TABLET | Freq: Three times a day (TID) | ORAL | Status: DC

## 2012-12-15 NOTE — Telephone Encounter (Signed)
Bio identical hormones online Tilden Fossa is an excellent resource for weight reduction.  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  Her phone number is [phone].   Was given to mother who called about the need of the meds for tremors.

## 2012-12-16 ENCOUNTER — Ambulatory Visit (INDEPENDENT_AMBULATORY_CARE_PROVIDER_SITE_OTHER): Payer: Managed Care, Other (non HMO) | Admitting: Psychiatry

## 2012-12-16 DIAGNOSIS — F3130 Bipolar disorder, current episode depressed, mild or moderate severity, unspecified: Secondary | ICD-10-CM
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
## 2012-12-21 NOTE — Patient Instructions (Addendum)
Discussed orally 

## 2012-12-24 ENCOUNTER — Ambulatory Visit (INDEPENDENT_AMBULATORY_CARE_PROVIDER_SITE_OTHER): Payer: Managed Care, Other (non HMO) | Admitting: Psychiatry

## 2012-12-24 DIAGNOSIS — F3130 Bipolar disorder, current episode depressed, mild or moderate severity, unspecified: Secondary | ICD-10-CM

## 2012-12-24 DIAGNOSIS — F4310 Post-traumatic stress disorder, unspecified: Secondary | ICD-10-CM

## 2012-12-27 NOTE — Patient Instructions (Signed)
Discussed orally 
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
## 2012-12-31 ENCOUNTER — Ambulatory Visit (INDEPENDENT_AMBULATORY_CARE_PROVIDER_SITE_OTHER): Payer: Managed Care, Other (non HMO) | Admitting: Psychiatry

## 2012-12-31 DIAGNOSIS — F3130 Bipolar disorder, current episode depressed, mild or moderate severity, unspecified: Secondary | ICD-10-CM

## 2012-12-31 DIAGNOSIS — F4310 Post-traumatic stress disorder, unspecified: Secondary | ICD-10-CM
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
## 2012-12-31 NOTE — Patient Instructions (Signed)
Discussed orally 

## 2013-01-05 ENCOUNTER — Encounter (HOSPITAL_COMMUNITY): Payer: Self-pay | Admitting: Psychiatry

## 2013-01-05 ENCOUNTER — Ambulatory Visit (INDEPENDENT_AMBULATORY_CARE_PROVIDER_SITE_OTHER): Payer: Managed Care, Other (non HMO) | Admitting: Psychiatry

## 2013-01-05 ENCOUNTER — Ambulatory Visit (HOSPITAL_COMMUNITY): Payer: Self-pay | Admitting: Psychiatry

## 2013-01-05 VITALS — BP 128/84 | Ht 61.25 in | Wt 239.8 lb

## 2013-01-05 DIAGNOSIS — F410 Panic disorder [episodic paroxysmal anxiety] without agoraphobia: Secondary | ICD-10-CM

## 2013-01-05 DIAGNOSIS — F411 Generalized anxiety disorder: Secondary | ICD-10-CM

## 2013-01-05 DIAGNOSIS — F5105 Insomnia due to other mental disorder: Secondary | ICD-10-CM

## 2013-01-05 DIAGNOSIS — F314 Bipolar disorder, current episode depressed, severe, without psychotic features: Secondary | ICD-10-CM

## 2013-01-05 DIAGNOSIS — R251 Tremor, unspecified: Secondary | ICD-10-CM

## 2013-01-05 DIAGNOSIS — F429 Obsessive-compulsive disorder, unspecified: Secondary | ICD-10-CM

## 2013-01-05 DIAGNOSIS — F4310 Post-traumatic stress disorder, unspecified: Secondary | ICD-10-CM

## 2013-01-05 DIAGNOSIS — F3130 Bipolar disorder, current episode depressed, mild or moderate severity, unspecified: Secondary | ICD-10-CM

## 2013-01-05 MED ORDER — CARBAMAZEPINE ER 100 MG PO TB12: 100.0000 mg | ORAL_TABLET | Freq: Two times a day (BID) | ORAL | Status: DC

## 2013-01-05 MED ORDER — QUETIAPINE FUMARATE 25 MG PO TABS: 25.0000 mg | ORAL_TABLET | Freq: Every day | ORAL | Status: DC

## 2013-01-05 MED ORDER — LITHIUM CARBONATE 300 MG PO TABS: 300.0000 mg | ORAL_TABLET | ORAL | Status: DC

## 2013-01-05 MED ORDER — PROPRANOLOL HCL 10 MG PO TABS: 10.0000 mg | ORAL_TABLET | Freq: Three times a day (TID) | ORAL | Status: DC

## 2013-01-05 MED ORDER — LITHIUM CARBONATE 600 MG PO CAPS: 600.0000 mg | ORAL_CAPSULE | Freq: Every day | ORAL | Status: DC

## 2013-01-05 MED ORDER — SERTRALINE HCL 50 MG PO TABS: 50.0000 mg | ORAL_TABLET | Freq: Every day | ORAL | Status: DC

## 2013-01-05 MED ORDER — HYDROXYZINE HCL 25 MG PO TABS: 25.0000 mg | ORAL_TABLET | Freq: Three times a day (TID) | ORAL | Status: DC | PRN
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
## 2013-01-05 NOTE — Patient Instructions (Signed)
Set a timer for 8 or a certain number minutes and walk for that amount of time in the house or in the yard.  Mark the number of minutes on a calendar for that day.  Do that every day this week.  Then next week increase the time by 1 minutes and then mark the calendar with the number of minutes for that day.  Each week increase your exercise by one minute.  Keep a record of this so you can see the progress you are making.  Do this every day, just like eating and sleeping.  It is good for pain control, depression, and for your soul/spirit.  Bring the record in for your next visit so we can talk about your effort and how you feel with the new exercise program going and working for you.  Relaxation is the ultimate solution for you.  You can seek it through tub baths, bubble baths, essential oils or incense, walking or chatting with friends, listening to soft music, watching a candle burn and just letting all thoughts go and appreciating the true essence of the Creator.  Pets or animals may be very helpful.  You might spend some time with them and then go do more directed meditation.  "I am Wishes Fulfilled Meditation" by Wayne W Dyer and James F Twyman may be helpful MUSIC for getting to sleep or for meditating You can order it from on line.  You might find the Chill channel on Pandora and explore the artists that you like better.   Take care of yourself.  No one else is standing up to do the job and only you know what you need.   GET SERIOUS about taking care of yourself.  Do the next right thing and that often means doing something to care for yourself along the lines of are you hungry, are you angry, are you lonely, are you tired, are you scared?  HALTS is what that stands for.  Call if problems or concerns. 

## 2013-01-10 ENCOUNTER — Ambulatory Visit (INDEPENDENT_AMBULATORY_CARE_PROVIDER_SITE_OTHER): Payer: Managed Care, Other (non HMO) | Admitting: Psychiatry

## 2013-01-10 DIAGNOSIS — F4310 Post-traumatic stress disorder, unspecified: Secondary | ICD-10-CM

## 2013-01-10 DIAGNOSIS — F314 Bipolar disorder, current episode depressed, severe, without psychotic features: Secondary | ICD-10-CM

## 2013-01-13 ENCOUNTER — Ambulatory Visit (HOSPITAL_COMMUNITY): Payer: Self-pay | Admitting: Psychiatry
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
## 2013-01-13 NOTE — Patient Instructions (Signed)
Discussed orally 

## 2013-01-18 ENCOUNTER — Ambulatory Visit (INDEPENDENT_AMBULATORY_CARE_PROVIDER_SITE_OTHER): Payer: Managed Care, Other (non HMO) | Admitting: Psychiatry

## 2013-01-18 DIAGNOSIS — F314 Bipolar disorder, current episode depressed, severe, without psychotic features: Secondary | ICD-10-CM

## 2013-01-18 DIAGNOSIS — F4310 Post-traumatic stress disorder, unspecified: Secondary | ICD-10-CM
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
## 2013-01-18 NOTE — Patient Instructions (Signed)
Discussed orally 

## 2013-01-31 ENCOUNTER — Ambulatory Visit (INDEPENDENT_AMBULATORY_CARE_PROVIDER_SITE_OTHER): Payer: Managed Care, Other (non HMO) | Admitting: Psychiatry

## 2013-01-31 DIAGNOSIS — F314 Bipolar disorder, current episode depressed, severe, without psychotic features: Secondary | ICD-10-CM

## 2013-01-31 DIAGNOSIS — F4310 Post-traumatic stress disorder, unspecified: Secondary | ICD-10-CM

## 2013-01-31 NOTE — Patient Instructions (Signed)
Helping Someone Who Is Suicidal  Take threats of suicide seriously. Listen to a suicidal person's thoughts and concerns with compassion. The fact that the person is talking to you is an important sign that he or she trusts you. Reasons for suicide can depend on where we are in life.    The younger person is often depressed over lost love.   The middle-aged person is often depressed over financial problems.   The elderly person is often depressed over health problems.  SIGNS IN FAMILY OR FRIENDS WHO ARE SUICIDAL INCLUDE:   Depression which suddenly gets better. Getting over depression is usually a gradual process. A sudden change may mean the person has suddenly thought of suicide as a "solution."   A sudden loss of interest in family and friends and social withdrawal.   Loss of personal hygiene habits and not caring for himself or herself.   Decline in handling of school, work, or other activities.   Injuries which are self-inflicted, such as burning or cutting.   Expressions of helplessness, hopelessness, and a sense of the loss of ability to handle life.   Risk-taking behavior, such as casual sex and drug use.  COMMON SUICIDE RISKS INCLUDE:   Death or terminal illness of a relative or friend.   Broken relationships.   Loss of health.   Financial losses.   Chemical abuse (drugs and alcohol).   Previous suicide attempts.  If you do not feel adequate to listen or help, ask the person if you can help him or her get help. Ask if you can share the person's concerns with someone else such as a professional counselor. Just talking with someone else is helpful and you can be that help by listening. Some helpful tips are:   Listen to the person's thoughts and concerns. Let the person unburden his or her troubles on you.   Let the person know you will not let him or her be alone with the pain.   Ask the person if he or she is having thoughts of hurting himself or herself.   Ask what you can do to help  lessen the pain.   Suggest that the person seek professional help and that you will assist him or her in finding help. Let the person know you will continue to be available to help.  GET HELP   Contact a suicide hotline, crisis center, or local suicide prevention center for help right away. Local centers may include a hospital, clinic, community service organization, social service provider, or health department.   Call your local emergency services (911 in the [place]).   Call a suicide hotline:   1-800-273-TALK (1-800-273-8255) in the [place].   1-800-SUICIDE (1-800-784-2433) in the [place].   1-888-628-9454 in the [place] for Spanish-speaking counselors.   1-800-799-4TTY (1-800-799-4889) in the [place] for TTY users.   Visit the following websites for information and help:   National Suicide Prevention Lifeline: www.suicidepreventionlifeline.org   Hopeline: www.hopeline.com   American Foundation for Suicide Prevention: www.afsp.org   For lesbian, gay, bisexual, transgender, or questioning youth, contact The Trevor Project:   1-866-4-U-TREVOR (1-866-488-7386) in the [place].   www.thetrevorproject.org   In [place], treatment resources are listed in each province with listings available under [employer] or similar titles. Another source for Crisis Centres by Province is located at http://www.suicideprevention.ca/in-crisis-now/find-a-crisis-centre-now/crisis-centres  Document Released: 02/22/2003 Document Revised: 11/10/2011 Document Reviewed: 04/25/2008  ExitCare Patient Information 2014 [employer].
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
## 2013-02-07 ENCOUNTER — Ambulatory Visit (INDEPENDENT_AMBULATORY_CARE_PROVIDER_SITE_OTHER): Payer: Managed Care, Other (non HMO) | Admitting: Psychiatry

## 2013-02-07 DIAGNOSIS — F4310 Post-traumatic stress disorder, unspecified: Secondary | ICD-10-CM

## 2013-02-07 DIAGNOSIS — F3130 Bipolar disorder, current episode depressed, mild or moderate severity, unspecified: Secondary | ICD-10-CM

## 2013-02-07 NOTE — Patient Instructions (Signed)
Discussed orally 
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
## 2013-02-14 ENCOUNTER — Ambulatory Visit (INDEPENDENT_AMBULATORY_CARE_PROVIDER_SITE_OTHER): Payer: Managed Care, Other (non HMO) | Admitting: Psychiatry

## 2013-02-14 DIAGNOSIS — F3130 Bipolar disorder, current episode depressed, mild or moderate severity, unspecified: Secondary | ICD-10-CM

## 2013-02-14 DIAGNOSIS — F4310 Post-traumatic stress disorder, unspecified: Secondary | ICD-10-CM
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
## 2013-02-14 NOTE — Patient Instructions (Signed)
Discussed orally 

## 2013-02-28 ENCOUNTER — Ambulatory Visit (INDEPENDENT_AMBULATORY_CARE_PROVIDER_SITE_OTHER): Payer: Managed Care, Other (non HMO) | Admitting: Psychiatry

## 2013-02-28 DIAGNOSIS — F4310 Post-traumatic stress disorder, unspecified: Secondary | ICD-10-CM

## 2013-02-28 DIAGNOSIS — F3130 Bipolar disorder, current episode depressed, mild or moderate severity, unspecified: Secondary | ICD-10-CM

## 2013-03-01 NOTE — Patient Instructions (Signed)
Discussed orally 
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
## 2013-03-08 ENCOUNTER — Ambulatory Visit (HOSPITAL_COMMUNITY): Payer: Self-pay | Admitting: Psychiatry

## 2013-03-10 ENCOUNTER — Ambulatory Visit (HOSPITAL_COMMUNITY): Payer: Self-pay | Admitting: Psychiatry

## 2013-03-10 ENCOUNTER — Ambulatory Visit (INDEPENDENT_AMBULATORY_CARE_PROVIDER_SITE_OTHER): Payer: Managed Care, Other (non HMO) | Admitting: Psychiatry

## 2013-03-10 ENCOUNTER — Encounter (HOSPITAL_COMMUNITY): Payer: Self-pay | Admitting: Psychiatry

## 2013-03-10 VITALS — Wt 239.8 lb

## 2013-03-10 DIAGNOSIS — F3130 Bipolar disorder, current episode depressed, mild or moderate severity, unspecified: Secondary | ICD-10-CM

## 2013-03-10 DIAGNOSIS — F314 Bipolar disorder, current episode depressed, severe, without psychotic features: Secondary | ICD-10-CM

## 2013-03-10 DIAGNOSIS — F411 Generalized anxiety disorder: Secondary | ICD-10-CM

## 2013-03-10 DIAGNOSIS — F429 Obsessive-compulsive disorder, unspecified: Secondary | ICD-10-CM

## 2013-03-10 DIAGNOSIS — F5105 Insomnia due to other mental disorder: Secondary | ICD-10-CM

## 2013-03-10 DIAGNOSIS — F410 Panic disorder [episodic paroxysmal anxiety] without agoraphobia: Secondary | ICD-10-CM

## 2013-03-10 DIAGNOSIS — R251 Tremor, unspecified: Secondary | ICD-10-CM

## 2013-03-10 DIAGNOSIS — F4310 Post-traumatic stress disorder, unspecified: Secondary | ICD-10-CM

## 2013-03-10 MED ORDER — SERTRALINE HCL 50 MG PO TABS: 50.0000 mg | ORAL_TABLET | Freq: Every day | ORAL | Status: DC

## 2013-03-10 MED ORDER — CARBAMAZEPINE ER 100 MG PO TB12: ORAL_TABLET | ORAL | Status: DC

## 2013-03-10 MED ORDER — PROPRANOLOL HCL 10 MG PO TABS: 10.0000 mg | ORAL_TABLET | Freq: Three times a day (TID) | ORAL | Status: DC

## 2013-03-10 MED ORDER — QUETIAPINE FUMARATE 50 MG PO TABS: 50.0000 mg | ORAL_TABLET | Freq: Every day | ORAL | Status: DC

## 2013-03-10 MED ORDER — HYDROXYZINE HCL 25 MG PO TABS: 25.0000 mg | ORAL_TABLET | Freq: Every evening | ORAL | Status: DC | PRN

## 2013-03-10 MED ORDER — LITHIUM CARBONATE 300 MG PO TABS: ORAL_TABLET | ORAL | Status: DC

## 2013-03-10 NOTE — Progress Notes (Signed)
[facility] [address] Progress Note TAMEA BAI MRN: [id number] DOB: 07-15-1991 Age: 22 y.o. Date : 03/10/2013  Chief Complaint:  Chief Complaint  Patient presents with  . Follow-up  . Fatigue  . Medication Refill   History of Chief Complaint:   Patient is 22 year old Caucasian female who came with her mother for her followup appointment.  She's complaining of diarrhea but does not believe it is coming from lithium.  She believed recently her dose of Tegretol was increased and that could be the reason.  She continues to have irritability anger mood swing.  She feels her lithium dose needed to be increased.  She's also not sleeping very well.  She is taking 25 mg of Seroquel .  She is anxious because she is moving to [place] to live with her friend.  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  She is hoping to be happening to see.  Patient is taking multiple psychiatric medication.  She was to followup in [place].  She's taking Zoloft, Vistaril, Seroquel, lithium, Tegretol and propranolol.  Her shakes are much improved with propranolol.  She continues to have same way.  She is trying hard to lose weight.  Her last lithium level was 0.71 .  She's not drinking or using any illegal substance.  She's no longer on Ativan.  She denies any active or passive suicidal thoughts or homicidal thoughts.    Anxiety Presents for follow-up visit. Symptoms include nervous/anxious behavior. Patient reports no confusion.     Review of Systems  Constitutional: Positive for fatigue.  Respiratory: Negative.   Gastrointestinal: Positive for diarrhea.  Musculoskeletal: Positive for back pain and arthralgias.  Neurological: Positive for tremors and headaches. Negative for seizures.  Psychiatric/Behavioral: Positive for sleep disturbance. Negative for behavioral problems and confusion. The patient is nervous/anxious.     Physical Exam Vitals: Wt 239 lb 12.8 oz (108.773 kg)  BMI 44.93  kg/m2  Traumatic Brain Injury: Yes Blunt Trauma  Past Psychiatric History: Diagnosis: Bipolar Disorder, PTSD, OCD, GAD, Panic D/O  Hospitalizations: last yr  Outpatient Care: [facility] Glassman, [facility] [place]  Substance Abuse Care: none  Self-Mutilation: cut w fingernails, any sharp object  Suicidal Attempts: 4 or 5  Violent Behaviors: none   Past Medical History:   Past Medical History  Diagnosis Date  . Anxiety   . Arrhythmia   . Asthma   . Bipolar disorder   . History of borderline personality disorder   . Depression   . Headache(784.0)   . Obsessive-compulsive disorder   . Oppositional defiant disorder   . PTSD (post-traumatic stress disorder)   . Psychosis   . Migraine headache with aura 11/17/2012    Associated with menses   . Menorrhagia    History of Loss of Consciousness:  No Seizure History:  No Cardiac History:  No Allergies:   Allergies  Allergen Reactions  . Hydrocodone Other (See Comments)    hallucinations  . Sulfa Antibiotics Anaphylaxis  . Codeine Nausea And Vomiting  . Lactose Intolerance (Gi) Diarrhea, Nausea And Vomiting and Other (See Comments)    syncope    . Lexapro (Escitalopram Oxalate) Other (See Comments)    Sleep walking talking, hallucinations  . Soap Hives, Dermatitis and Other (See Comments)    Anxiety and going crazy  . Adhesive (Tape) Rash   Current Outpatient Prescriptions  Medication Sig Dispense Refill  . calcium-vitamin D (OSCAL WITH D) 250-125 MG-UNIT per tablet Take 1 tablet by  mouth 2 (two) times daily.      . carbamazepine (TEGRETOL-XR) 100 MG 12 hr tablet Take 2 tab daily for one week and than 1 daily for week.  60 tablet  0  . fexofenadine-pseudoephedrine (ALLEGRA-D 24) 180-240 MG per 24 hr tablet Take 1 tablet by mouth daily.      . fish oil-omega-3 fatty acids 1000 MG capsule Take 1 g by mouth 3 (three) times daily.      . hydrOXYzine (ATARAX/VISTARIL) 25 MG tablet Take 1 tablet (25 mg total) by mouth at bedtime as  needed for itching or anxiety (insomnia).  30 tablet  1  . lithium 300 MG tablet Take 2 tab twice a day  120 tablet  1  . Multiple Vitamin (MULTIVITAMIN) tablet Take 1 tablet by mouth daily.      . naproxen (NAPROSYN) 500 MG tablet Take 1 tablet (500 mg total) by mouth 2 (two) times daily as needed (Only when on Period).  40 tablet  1  . propranolol (INDERAL) 10 MG tablet Take 1 tablet (10 mg total) by mouth 3 (three) times daily.  90 tablet  1  . QUEtiapine (SEROQUEL) 50 MG tablet Take 1 tablet (50 mg total) by mouth at bedtime.  30 tablet  1  . sertraline (ZOLOFT) 50 MG tablet Take 1 tablet (50 mg total) by mouth daily.  30 tablet  1  . norethindrone (MICRONOR,CAMILA,ERRIN) 0.35 MG tablet Take 1 tablet (0.35 mg total) by mouth daily.  1 Package  11  . ondansetron (ZOFRAN ODT) 8 MG disintegrating tablet Take 1 tablet (8 mg total) by mouth every 8 (eight) hours as needed for nausea.  10 tablet  1   No current facility-administered medications for this visit.    Family History:   Family History  Problem Relation Age of Onset  . Anxiety disorder Mother   . OCD Mother   . Sexual abuse Mother   . Physical abuse Mother   . Multiple sclerosis Mother   . Alcohol abuse Father   . Drug abuse Father   . Paranoid behavior Father   . ADD / ADHD Sister   . Depression Maternal Aunt     MGA  . Dementia Maternal Grandfather     MGGF  . Anxiety disorder Maternal Grandmother   . Bipolar disorder Maternal Grandmother   . Alcohol abuse Paternal Grandfather   . Dementia Paternal Grandmother     PGGM  . Bipolar disorder Cousin   . Schizophrenia Cousin   . Seizures Maternal Aunt    Mental Status Examination/Evaluation: Objective:  Appearance: Casual  Eye Contact::  Good  Speech:  Clear and Coherent  Volume:  Normal  Mood:  pretty good  Affect:  Congruent  Thought Process:  Coherent, Intact and Logical  Orientation:  Full (Time, Place, and Person)  Thought Content:  WDL  Suicidal Thoughts:   No  Homicidal Thoughts:  No  Judgement:  Good  Insight:  Good  Psychomotor Activity:  Normal  Akathisia:  No  Handed:  Right  AIMS (if indicated):    Assets:  Communication Skills Desire for Improvement    Laboratory/X-Ray Psychological Evaluation(s)   none  none   Assessment:    AXIS I Bipolar, Depressed, Generalized Anxiety Disorder, Obsessive Compulsive Disorder, Panic Disorder, Post Traumatic Stress Disorder and rule out CNS complication  AXIS II Deferred  AXIS III Past Medical History  Diagnosis Date  . Anxiety   . Arrhythmia   . Asthma   .  Bipolar disorder   . History of borderline personality disorder   . Depression   . Headache(784.0)   . Obsessive-compulsive disorder   . Oppositional defiant disorder   . PTSD (post-traumatic stress disorder)   . Psychosis   . Migraine headache with aura 11/17/2012    Associated with menses   . Menorrhagia      AXIS IV other psychosocial or environmental problems  AXIS V 41-50 serious symptoms   Treatment Plan/Recommendations:  Laboratory:  none  Psychotherapy: supportive  Medications: Continue current meds and add Tegretol for the CNS problem  Routine PRN Medications:  No  Consultations: none  Safety Concerns:  none  Other:     Plan/Discussion: I recommend to try lithium 600 mg twice a day since patient does not believe her diarrhea and tremors are coming from lithium.  She believed that it is coming from Tegretol.  I recommend to taper the Tegretol and stop after one week.  I also recommend to try Seroquel 50 mg to help the mood lability and sleep.  Patient is going to [place] next week.  Recommend to followup with a psychiatrist in [place].  Discuss safety plan that anytime having active suicidal thoughts or homicidal thoughts continue to call 911 or go to local emergency room.  Prescription of hydroxyzine, lithium, propranolol, Seroquel, Zoloft is given for 30 days of admission 1 refill.  Time spent 25 minutes.   More than 50% of the time spent and psychoeducation, counseling and coordination of care.  MEDICATIONS this encounter: Meds ordered this encounter  Medications  . QUEtiapine (SEROQUEL) 50 MG tablet    Sig: Take 1 tablet (50 mg total) by mouth at bedtime.    Dispense:  30 tablet    Refill:  1  . sertraline (ZOLOFT) 50 MG tablet    Sig: Take 1 tablet (50 mg total) by mouth daily.    Dispense:  30 tablet    Refill:  1  . propranolol (INDERAL) 10 MG tablet    Sig: Take 1 tablet (10 mg total) by mouth 3 (three) times daily.    Dispense:  90 tablet    Refill:  1  . lithium 300 MG tablet    Sig: Take 2 tab twice a day    Dispense:  120 tablet    Refill:  1  . hydrOXYzine (ATARAX/VISTARIL) 25 MG tablet    Sig: Take 1 tablet (25 mg total) by mouth at bedtime as needed for itching or anxiety (insomnia).    Dispense:  30 tablet    Refill:  1  . carbamazepine (TEGRETOL-XR) 100 MG 12 hr tablet    Sig: Take 2 tab daily for one week and than 1 daily for week.    Dispense:  60 tablet    Refill:  0    Medical Decision Making Problem Points:  Established problem, worsening (2), Review of last therapy session (1) and Review of psycho-social stressors (1) Data Points:  Review or order clinical lab tests (1) Review of medication regiment & side effects (2) Review of new medications or change in dosage (2)  I certify that outpatient services furnished can reasonably be expected to improve the patient's condition.   Ely Spragg T., MD

## 2013-08-22 ENCOUNTER — Ambulatory Visit (INDEPENDENT_AMBULATORY_CARE_PROVIDER_SITE_OTHER): Payer: Managed Care, Other (non HMO) | Admitting: Psychiatry

## 2013-08-22 ENCOUNTER — Encounter (HOSPITAL_COMMUNITY): Payer: Self-pay | Admitting: Psychiatry

## 2013-08-22 VITALS — BP 130/80 | Ht 61.0 in | Wt 241.0 lb

## 2013-08-22 DIAGNOSIS — F314 Bipolar disorder, current episode depressed, severe, without psychotic features: Secondary | ICD-10-CM

## 2013-08-22 DIAGNOSIS — F5105 Insomnia due to other mental disorder: Secondary | ICD-10-CM

## 2013-08-22 DIAGNOSIS — F3130 Bipolar disorder, current episode depressed, mild or moderate severity, unspecified: Secondary | ICD-10-CM

## 2013-08-22 DIAGNOSIS — F411 Generalized anxiety disorder: Secondary | ICD-10-CM

## 2013-08-22 DIAGNOSIS — R251 Tremor, unspecified: Secondary | ICD-10-CM

## 2013-08-22 DIAGNOSIS — F4310 Post-traumatic stress disorder, unspecified: Secondary | ICD-10-CM

## 2013-08-22 DIAGNOSIS — F429 Obsessive-compulsive disorder, unspecified: Secondary | ICD-10-CM

## 2013-08-22 DIAGNOSIS — F410 Panic disorder [episodic paroxysmal anxiety] without agoraphobia: Secondary | ICD-10-CM

## 2013-08-22 MED ORDER — HYDROXYZINE HCL 25 MG PO TABS: 25.0000 mg | ORAL_TABLET | Freq: Every evening | ORAL | Status: DC | PRN

## 2013-08-22 MED ORDER — LITHIUM CARBONATE 300 MG PO TABS: ORAL_TABLET | ORAL | Status: DC

## 2013-08-22 MED ORDER — QUETIAPINE FUMARATE 50 MG PO TABS: 50.0000 mg | ORAL_TABLET | Freq: Every day | ORAL | Status: DC

## 2013-08-22 MED ORDER — SERTRALINE HCL 50 MG PO TABS: 50.0000 mg | ORAL_TABLET | Freq: Every day | ORAL | Status: DC

## 2013-08-22 MED ORDER — PROPRANOLOL HCL 10 MG PO TABS: 10.0000 mg | ORAL_TABLET | Freq: Three times a day (TID) | ORAL | Status: DC
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
## 2013-08-23 ENCOUNTER — Telehealth (HOSPITAL_COMMUNITY): Payer: Self-pay | Admitting: Psychiatry

## 2013-08-23 ENCOUNTER — Other Ambulatory Visit (HOSPITAL_COMMUNITY): Payer: Self-pay | Admitting: Psychiatry

## 2013-08-23 DIAGNOSIS — F410 Panic disorder [episodic paroxysmal anxiety] without agoraphobia: Secondary | ICD-10-CM

## 2013-08-23 DIAGNOSIS — F4310 Post-traumatic stress disorder, unspecified: Secondary | ICD-10-CM

## 2013-08-23 MED ORDER — SERTRALINE HCL 50 MG PO TABS: 50.0000 mg | ORAL_TABLET | Freq: Every day | ORAL | Status: DC

## 2013-08-23 NOTE — Telephone Encounter (Signed)
Resent

## 2013-09-19 ENCOUNTER — Ambulatory Visit (HOSPITAL_COMMUNITY): Payer: Self-pay | Admitting: Psychiatry

## 2019-12-08 ENCOUNTER — Other Ambulatory Visit: Payer: Self-pay

## 2019-12-08 ENCOUNTER — Ambulatory Visit
Admission: EM | Admit: 2019-12-08 | Discharge: 2019-12-08 | Disposition: A | Discharge status: Home / Self Care | Payer: 59 | Attending: Emergency Medicine | Admitting: Emergency Medicine

## 2019-12-08 DIAGNOSIS — M79644 Pain in right finger(s): Secondary | ICD-10-CM

## 2019-12-08 DIAGNOSIS — M654 Radial styloid tenosynovitis [de Quervain]: Secondary | ICD-10-CM | POA: Diagnosis not present

## 2019-12-08 DIAGNOSIS — G5601 Carpal tunnel syndrome, right upper limb: Secondary | ICD-10-CM

## 2019-12-08 MED ORDER — METHYLPREDNISOLONE SODIUM SUCC 125 MG IJ SOLR
125.0000 mg | Freq: Once | INTRAMUSCULAR | Status: AC
  Administered 2019-12-08: 125 mg via INTRAMUSCULAR

## 2019-12-08 MED ORDER — PREDNISONE 10 MG (21) PO TBPK: ORAL_TABLET | Freq: Every day | ORAL | 0 refills | Status: DC

## 2019-12-08 NOTE — ED Provider Notes (Signed)
Paula Kane   [id number] 12/08/19 Arrival Time: 1648  CC: RT wrist pain  SUBJECTIVE: History from: patient. Paula Kane is a 29 y.o. female complains of RT wrist pain that began 3 weeks ago.  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  Localizes the pain to the outside of wrist.  Describes the pain as intermittent and achy in character.  Has tried OTC medications without relief.  Symptoms are made worse with using the computer and to the touch.  Reports hx of CTS, but denies wrist pain with CTS.  Complains of associated swelling, N/T, and weakness.   Denies fever, chills, erythema, ecchymosis.    ROS: As per HPI.  All other pertinent ROS negative.     Past Medical History:  Diagnosis Date  . Anxiety   . Arrhythmia   . Asthma   . Bipolar disorder   . Depression   . Headache(784.0)   . History of borderline personality disorder   . Menorrhagia   . Migraine headache with aura 11/17/2012   Associated with menses   . Obsessive-compulsive disorder   . Oppositional defiant disorder   . Psychosis   . PTSD (post-traumatic stress disorder)    No past surgical history on file. Allergies  Allergen Reactions  . Hydrocodone Other (See Comments)    hallucinations  . Sulfa Antibiotics Anaphylaxis  . Codeine Nausea And Vomiting  . Lactose Intolerance (Gi) Diarrhea, Nausea And Vomiting and Other (See Comments)    syncope    . Lexapro [Escitalopram Oxalate] Other (See Comments)    Sleep walking talking, hallucinations  . Soap Hives, Dermatitis and Other (See Comments)    Anxiety and going crazy  . Adhesive [Tape] Rash   No current facility-administered medications on file prior to encounter.   Current Outpatient Medications on File Prior to Encounter  Medication Sig Dispense Refill  . calcium-vitamin D (OSCAL WITH D) 250-125 MG-UNIT per tablet Take 1 tablet by mouth 2 (two) times daily.    . fexofenadine-pseudoephedrine  (ALLEGRA-D 24) 180-240 MG per 24 hr tablet Take 1 tablet by mouth daily.    . fish oil-omega-3 fatty acids 1000 MG capsule Take 1 g by mouth 3 (three) times daily.    . hydrOXYzine (ATARAX/VISTARIL) 25 MG tablet Take 1 tablet (25 mg total) by mouth at bedtime as needed for itching or anxiety (insomnia). 90 tablet 1  . lithium 300 MG tablet Take 2 tab twice a day 360 tablet 1  . Multiple Vitamin (MULTIVITAMIN) tablet Take 1 tablet by mouth daily.    . naproxen (NAPROSYN) 500 MG tablet Take 1 tablet (500 mg total) by mouth 2 (two) times daily as needed (Only when on Period). 40 tablet 1  . norethindrone (MICRONOR,CAMILA,ERRIN) 0.35 MG tablet Take 1 tablet (0.35 mg total) by mouth daily. 1 Package 11  . propranolol (INDERAL) 10 MG tablet Take 1 tablet (10 mg total) by mouth 3 (three) times daily. 270 tablet 1  . QUEtiapine (SEROQUEL) 50 MG tablet Take 1 tablet (50 mg total) by mouth at bedtime. 90 tablet 1  . sertraline (ZOLOFT) 50 MG tablet Take 1 tablet (50 mg total) by mouth daily. 90 tablet 1   Social History   Socioeconomic History  . Marital status: Single    Spouse name: Not on file  . Number of children: Not on file  . Years of education: Not on file  . Highest education level: Not on file  Occupational History  . Not on file  Tobacco Use  . Smoking status: Passive Smoke Exposure - Never Smoker  . Smokeless tobacco: Never Used  Substance and Sexual Activity  . Alcohol use: Yes    Comment: occ.  . Drug use: No  . Sexual activity: Not Currently    Birth control/protection: None    Comment: last 2 relationships were with women  Other Topics Concern  . Not on file  Social History Narrative  . Not on file   Social Determinants of Health   Financial Resource Strain:   . Difficulty of Paying Living Expenses:   Food Insecurity:   . Worried About Programme researcher, broadcasting/film/video in the Last Year:   . Barista in the Last Year:   Transportation Needs:   . Freight forwarder  (Medical):   Marland Kitchen Lack of Transportation (Non-Medical):   Physical Activity:   . Days of Exercise per Week:   . Minutes of Exercise per Session:   Stress:   . Feeling of Stress :   Social Connections:   . Frequency of Communication with Friends and Family:   . Frequency of Social Gatherings with Friends and Family:   . Attends Religious Services:   . Active Member of Clubs or Organizations:   . Attends Banker Meetings:   Marland Kitchen Marital Status:   Intimate Partner Violence:   . Fear of Current or Ex-Partner:   . Emotionally Abused:   Marland Kitchen Physically Abused:   . Sexually Abused:    Family History  Problem Relation Age of Onset  . Anxiety disorder Mother   . OCD Mother   . Sexual abuse Mother   . Physical abuse Mother   . Multiple sclerosis Mother   . Alcohol abuse Father   . Drug abuse Father   . Paranoid behavior Father   . ADD / ADHD Sister   . Depression Maternal Aunt        MGA  . Dementia Maternal Grandfather        MGGF  . Anxiety disorder Maternal Grandmother   . Bipolar disorder Maternal Grandmother   . Alcohol abuse Paternal Grandfather   . Dementia Paternal Grandmother        PGGM  . Bipolar disorder Cousin   . Schizophrenia Cousin   . Seizures Maternal Aunt     OBJECTIVE:  Vitals:   12/08/19 1702  BP: 133/71  Pulse: (!) 101  Resp: 17  Temp: 99.1 F (37.3 C)  TempSrc: Oral  SpO2: 98%    General appearance: ALERT; in no acute distress.  Head: NCAT Lungs: Normal respiratory effort CV: Radial pulse 2+ . Cap refill < 2 seconds Musculoskeletal: RT wrist  Inspection: Swelling over lateral wrist Palpation: TTP over base of thumb and lateral wrist, and snuff box ROM: FROM active and passive Strength: decreased grip strength Special test: +Phalen's  Skin: warm and dry Neurologic: Ambulates without difficulty; Sensation intact about the upper extremities Psychological: alert and cooperative; normal mood and affect  ASSESSMENT & PLAN:  1. De  Quervain's tenosynovitis, right   2. Carpal tunnel syndrome of right wrist   3. Thumb pain, right     Meds ordered this encounter  Medications  . methylPREDNISolone sodium succinate (SOLU-MEDROL) 125 mg/2 mL injection 125 mg   Steroid shot given in office Thumb spica brace given Continue conservative management of rest, ice, and elevation Prednisone prescribed.  Take as directed and to completion Follow up with orthopedist  for further evaluation and management Return or go to the ER if you have any new or worsening symptoms (fever, chills, chest pain, redness, swelling, worsening symptoms despite treatment, etc...)   Reviewed expectations re: course of current medical issues. Questions answered. Outlined signs and symptoms indicating need for more acute intervention. Patient verbalized understanding. After Visit Summary given.    Lestine Box, PA-C 12/08/19 1720

## 2019-12-08 NOTE — ED Triage Notes (Signed)
Pt presents with complaints of right wrist pain that started x 3 weeks ago. Denies any injury.

## 2019-12-08 NOTE — Discharge Instructions (Addendum)
Steroid shot given in office Thumb spica brace given Continue conservative management of rest, ice, and elevation Prednisone prescribed.  Take as directed and to completion Follow up with orthopedist for further evaluation and management Return or go to the ER if you have any new or worsening symptoms (fever, chills, chest pain, redness, swelling, worsening symptoms despite treatment, etc...)

## 2020-01-12 ENCOUNTER — Other Ambulatory Visit: Payer: Self-pay

## 2020-01-12 ENCOUNTER — Encounter: Payer: Self-pay | Admitting: Orthopedic Surgery

## 2020-01-12 ENCOUNTER — Ambulatory Visit (INDEPENDENT_AMBULATORY_CARE_PROVIDER_SITE_OTHER): Payer: 59 | Admitting: Orthopedic Surgery

## 2020-01-12 ENCOUNTER — Telehealth: Payer: Self-pay | Admitting: Orthopedic Surgery

## 2020-01-12 VITALS — BP 121/82 | HR 88 | Temp 98.2°F | Ht 60.0 in | Wt 299.0 lb

## 2020-01-12 DIAGNOSIS — Z6841 Body Mass Index (BMI) 40.0 and over, adult: Secondary | ICD-10-CM

## 2020-01-12 DIAGNOSIS — M654 Radial styloid tenosynovitis [de Quervain]: Secondary | ICD-10-CM

## 2020-01-12 MED ORDER — MELOXICAM 7.5 MG PO TABS: 7.5000 mg | ORAL_TABLET | Freq: Every day | ORAL | 5 refills | Status: DC

## 2020-01-12 NOTE — Telephone Encounter (Signed)
Patient called back following her office visit; states that she was to have a prescription ordered - chart note indicates:  "Start meloxicam 1 tablet daily"   IT sales professional, [employer]

## 2020-01-12 NOTE — Patient Instructions (Addendum)
Start meloxicam 1 tablet daily  20 minutes of ice at the end of the day  Wear thumb isolator brace full-time for 3 weeks and you received an injection De Quervain's Tenosynovitis  De Quervain's tenosynovitis is a condition that causes inflammation of the tendon on the thumb side of the wrist. Tendons are cords of tissue that connect bones to muscles. The tendons in the hand pass through a tunnel called a sheath. A slippery layer of tissue (synovium) lets the tendons move smoothly in the sheath. With de Quervain's tenosynovitis, the sheath swells or thickens, causing friction and pain. The condition is also called de Quervain's disease and de Quervain's syndrome. It occurs most often in women who are 68-38 years old. What are the causes? The exact cause of this condition is not known. It may be associated with overuse of the hand and wrist. What increases the risk? You are more likely to develop this condition if you:  Use your hands far more than normal, especially if you repeat certain movements that involve twisting your hand or using a tight grip.  Are pregnant.  Are a middle-aged woman.  Have rheumatoid arthritis.  Have diabetes. What are the signs or symptoms? The main symptom of this condition is pain on the thumb side of the wrist. The pain may get worse when you grasp something or turn your wrist. Other symptoms may include:  Pain that extends up the forearm.  Swelling of your wrist and hand.  Trouble moving the thumb and wrist.  A sensation of snapping in the wrist.  A bump filled with fluid (cyst) in the area of the pain. How is this diagnosed? This condition may be diagnosed based on:  Your symptoms and medical history.  A physical exam. During the exam, your health care provider may do a simple test Wynn Maudlin test) that involves pulling your thumb and wrist to see if this causes pain. You may also need to have an X-ray. How is this treated? Treatment for this  condition may include:  Avoiding any activity that causes pain and swelling.  Taking medicines. Anti-inflammatory medicines and corticosteroid injections may be used to reduce inflammation and relieve pain.  Wearing a splint.  Having surgery. This may be needed if other treatments do not work. Once the pain and swelling has gone down:  Physical therapy. This includes stretching and strengthening exercises.  Occupational therapy. This includes adjusting how you move your wrist. Follow these instructions at home: If you have a splint:  Wear the splint as told by your health care provider. Remove it only as told by your health care provider.  Loosen the splint if your fingers tingle, become numb, or turn cold and blue.  Keep the splint clean.  If the splint is not waterproof: ? Do not let it get wet. ? Cover it with a watertight covering when you take a bath or a shower. Managing pain, stiffness, and swelling   Avoid movements and activities that cause pain and swelling in the wrist area.  If directed, put ice on the painful area. This may be helpful after doing activities that involve the sore wrist. ? Put ice in a plastic bag. ? Place a towel between your skin and the bag. ? Leave the ice on for 20 minutes, 2-3 times a day.  Move your fingers often to avoid stiffness and to lessen swelling.  Raise (elevate) the injured area above the level of your heart while you are sitting or lying  down. General instructions  Return to your normal activities as told by your health care provider. Ask your health care provider what activities are safe for you.  Take over-the-counter and prescription medicines only as told by your health care provider.  Keep all follow-up visits as told by your health care provider. This is important. Contact a health care provider if:  Your pain medicine does not help.  Your pain gets worse.  You develop new symptoms. Summary  De Quervain's  tenosynovitis is a condition that causes inflammation of the tendon on the thumb side of the wrist.  The condition occurs most often in women who are 73-63 years old.  The exact cause of this condition is not known. It may be associated with overuse of the hand and wrist.  Treatment starts with avoiding activity that causes pain or swelling in the wrist area. Other treatment may include wearing a splint and taking medicine. Sometimes, surgery is needed. This information is not intended to replace advice given to you by your health care provider. Make sure you discuss any questions you have with your health care provider. Document Revised: 02/18/2018 Document Reviewed: 07/27/2017 Elsevier Patient Education  2020 [employer].

## 2020-01-12 NOTE — Progress Notes (Addendum)
Chief Complaint  Patient presents with  . Wrist Pain    Right CTS.    Pain right thumb radial side.  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  She was seen at urgent care got an IM shot of Toradol and an oral dose of prednisone but once the prednisone taper started the symptoms came back.  We also have a situation where the prednisone causes her to have some type of depression reaction because it relates with one of her medications  Review of systems reveal she has a history of carpal tunnel syndrome.  That seems to be stable she has some depression suicidal ideation hallucinations nervousness tingling tremor easy bruising pollen allergy otherwise normal  Past Medical History:  Diagnosis Date  . Anxiety   . Arrhythmia   . Asthma   . Bipolar disorder (HCC)   . Depression   . Headache(784.0)   . History of borderline personality disorder   . Menorrhagia   . Migraine headache with aura 11/17/2012   Associated with menses   . Obsessive-compulsive disorder   . Oppositional defiant disorder   . Psychosis (HCC)   . PTSD (post-traumatic stress disorder)    History reviewed. No pertinent surgical history.  BP 121/82   Pulse 88   Temp 98.2 F (36.8 C)   Ht 5' (1.524 m)   Wt 299 lb (135.6 kg)   BMI 58.39 kg/m  The patient meets the AMA guidelines for Morbid (severe) obesity with a BMI > 40.0 and I have recommended weight loss.  Normal development grooming and hygiene.  Patient has moderate to severe Bil obesity  She has pleasant mood and affect no signs of depression  Examination of the right wrist reveals tenderness and swelling in the first extensor compartment positive Finkelstein's test.  No neurovascular deficits color temperature of the hand is normal.  She has weak pinch but normal flexor tendon function.  Encounter Diagnoses   Name Primary?  Tommi Rumps Quervain's disease (radial styloid tenosynovitis), right Yes  . Body mass index 60.0-69.9, adult (HCC)   . Body mass index 50.0-59.9, adult (HCC)   . Morbid obesity (HCC)     Injection first extensor compartment right wrist  Patient gave consent and site was identified.  Cleaned with ethyl chloride and alcohol and then 40 mg of Depo-Medrol and 3 cc 1% lidocaine injected first extensor compartment without complication  Plan meloxicam 7.5 mg daily  Ice at the end the day for 20 minutes  Wear thumb isolator brace continuously  Injection  Follow-up 3 weeks.  If no improvement of a significant amount then recommend surgical release  Meds ordered this encounter  Medications  . meloxicam (MOBIC) 7.5 MG tablet    Sig: Take 1 tablet (7.5 mg total) by mouth daily.    Dispense:  30 tablet    Refill:  5

## 2020-01-12 NOTE — Addendum Note (Signed)
Addended by: Vickki Hearing on: 01/12/2020 04:47 PM   Modules accepted: Orders

## 2020-02-02 ENCOUNTER — Other Ambulatory Visit: Payer: Self-pay

## 2020-02-02 ENCOUNTER — Encounter: Payer: Self-pay | Admitting: Orthopedic Surgery

## 2020-02-02 ENCOUNTER — Ambulatory Visit (INDEPENDENT_AMBULATORY_CARE_PROVIDER_SITE_OTHER): Payer: 59 | Admitting: Orthopedic Surgery

## 2020-02-02 VITALS — BP 126/98 | HR 82 | Ht 60.0 in | Wt 300.0 lb

## 2020-02-02 DIAGNOSIS — M654 Radial styloid tenosynovitis [de Quervain]: Secondary | ICD-10-CM | POA: Diagnosis not present

## 2020-02-02 DIAGNOSIS — Z6841 Body Mass Index (BMI) 40.0 and over, adult: Secondary | ICD-10-CM | POA: Diagnosis not present

## 2020-02-02 NOTE — Progress Notes (Signed)
Chief Complaint  Patient presents with  . Hand Pain    right better, how long will she need to take meds??    29 year old female with de Quervain's syndrome on meloxicam injection and bracing says her pain is much less she can hold a pencil  Its been 3 weeks since we injected her and that she is taken the medicine and worn the brace  Examination shows a mildly positive Finkelstein sign tenderness over the thumb and the first extensor compartment  Overall improved continue medication bracing follow-up in 3 weeks  Encounter Diagnoses  Name Primary?  Paula Kane Quervain's disease (radial styloid tenosynovitis), right Yes  . Body mass index 60.0-69.9, adult (HCC)   . Morbid obesity (HCC)

## 2020-02-28 ENCOUNTER — Ambulatory Visit: Payer: 59 | Admitting: Orthopedic Surgery

## 2020-02-28 ENCOUNTER — Encounter: Payer: Self-pay | Admitting: Orthopedic Surgery

## 2020-08-27 ENCOUNTER — Other Ambulatory Visit: Payer: Self-pay | Admitting: Orthopedic Surgery

## 2020-08-27 DIAGNOSIS — M654 Radial styloid tenosynovitis [de Quervain]: Secondary | ICD-10-CM

## 2020-10-09 ENCOUNTER — Other Ambulatory Visit: Payer: Self-pay | Admitting: Orthopedic Surgery

## 2020-10-09 DIAGNOSIS — M654 Radial styloid tenosynovitis [de Quervain]: Secondary | ICD-10-CM

## 2020-11-03 ENCOUNTER — Other Ambulatory Visit: Payer: Self-pay | Admitting: Orthopedic Surgery

## 2020-11-03 DIAGNOSIS — M654 Radial styloid tenosynovitis [de Quervain]: Secondary | ICD-10-CM

## 2020-12-03 ENCOUNTER — Encounter: Payer: Self-pay | Admitting: Orthopedic Surgery

## 2020-12-03 ENCOUNTER — Ambulatory Visit (INDEPENDENT_AMBULATORY_CARE_PROVIDER_SITE_OTHER): Payer: 59 | Admitting: Orthopedic Surgery

## 2020-12-03 ENCOUNTER — Other Ambulatory Visit: Payer: Self-pay

## 2020-12-03 VITALS — BP 144/101 | HR 90 | Ht 61.0 in | Wt 298.4 lb

## 2020-12-03 DIAGNOSIS — M654 Radial styloid tenosynovitis [de Quervain]: Secondary | ICD-10-CM | POA: Diagnosis not present

## 2020-12-03 NOTE — Patient Instructions (Addendum)
Surgery will be April 12th you will get call with preop and covid testing they will give you instructions for the surgery at pre op Note out of work x 2weeks after surgery  Paula Kane Tenosynovitis Surgical Release, Care After This sheet gives you information about how to care for yourself after your procedure. Your health care provider may also give you more specific instructions. If you have problems or questions, contact your health care provider. What can I expect after the procedure? After the procedure, it is common to have:  Mild pain in your wrist and thumb area.  A small amount of blood or clear fluid coming from your incision. Follow these instructions at home: Medicines  Take over-the-counter and prescription medicines only as told by your health care provider.  Ask your health care provider if the medicine prescribed to you: ? Requires you to avoid driving or using machinery. ? Can cause constipation. You may need to take these actions to prevent or treat constipation:  Drink enough fluid to keep your urine pale yellow.  Take over-the-counter or prescription medicines.  Eat foods that are high in fiber, such as beans, whole grains, and fresh fruits and vegetables.  Limit foods that are high in fat and processed sugars, such as fried or sweet foods. If you have a splint or brace:  Wear it as told by your health care provider. Remove it only as told by your health care provider.  Loosen it if your fingers tingle, become numb, or turn cold and blue.  Check the skin around it every day. Tell your health care provider about any concerns.  Keep it clean and dry. Bathing  Do not take baths, swim, or use a hot tub until your health care provider approves. Ask your health care provider if you may take showers.  Keep your bandage (dressing) dry until your health care provider says it can be removed.  If the splint or brace is not waterproof: ? Do not let it get  wet. ? Cover it with a watertight covering when you take a bath or shower. Incision care  Follow instructions from your health care provider about how to take care of your incision. Make sure you: ? Wash your hands with soap and water for at least 20 seconds before and after you change your dressing. If soap and water are not available, use hand sanitizer. ? Change your dressing as told by your health care provider. ? Leave stitches (sutures), skin glue, or adhesive strips in place. These skin closures may need to stay in place for 2 weeks or longer. If adhesive strip edges start to loosen and curl up, you may trim the loose edges. Do not remove adhesive strips completely unless your health care provider tells you to do that.  Check your incision area every day for signs of infection. Check for: ? More redness, swelling, or pain. ? More fluid or blood. ? Warmth. ? Pus or a bad smell.   Managing pain, stiffness, and swelling  If directed, put ice on the affected area. To do this: ? If you have a removable splint or brace, remove it as told by your health care provider. ? Put ice in a plastic bag. ? Place a towel between your skin and the bag. ? Leave the ice on for 20 minutes, 2-3 times a day. ? Remove the ice if your skin turns bright red. This is very important. If you cannot feel pain, heat, or cold, you  have a greater risk of damage to the area.  Move your fingers often to reduce stiffness and swelling.  Raise (elevate) your wrist above the level of your heart while you are sitting or lying down.   Driving  Ask your health care provider when it is safe for you to drive.  If you were given a sedative during the procedure, it can affect you for several hours. Do not drive or operate machinery until your health care provider says that it is safe. Activity  Rest as told by your health care provider.  Return to your normal activities as told by your health care provider. Ask your  health care provider what activities are safe for you.  Do not lift anything with your affected hand until your health care provider approves.  Avoid gripping, pushing, and pulling with the affected hand.  If physical therapy was prescribed, do exercises as told by your health care provider. General instructions  Do not use any products that contain nicotine or tobacco, such as cigarettes, e-cigarettes, and chewing tobacco. These can delay healing after surgery. If you need help quitting, ask your health care provider.  Keep all follow-up visits. This is important. Contact a health care provider if:  You have problems with your splint or brace.  Your pain gets worse or does not get better with medicine.  You have more redness, swelling, or pain around your incision.  You have more fluid or blood coming from your incision.  Your incision feels warm to the touch.  You have pus or a bad smell coming from your incision.  You have a fever. Get help right away if:  You lose feeling in any part of your hand, wrist, or arm. Summary  After the procedure, it is common to have mild pain in your wrist and thumb area, and a small amount of blood or clear fluid coming from your incision.  Use ice and elevate your affected wrist to help reduce pain and swelling.  Wear your splint or brace as told by your health care provider.  Do not lift anything with your affected hand until your health care provider approves. This information is not intended to replace advice given to you by your health care provider. Make sure you discuss any questions you have with your health care provider. Document Revised: 11/30/2019 Document Reviewed: 11/30/2019 Elsevier Patient Education  2021 [employer].

## 2020-12-03 NOTE — Progress Notes (Signed)
Chief Complaint  Patient presents with  . Wrist Pain    R/   Encounter Diagnosis  Name Primary?  Tommi Rumps Quervain's disease (radial styloid tenosynovitis) Yes    30 year old female history of de Quervain's syndrome treated nonoperatively with bracing and injection in anti-inflammatories we were getting some improvement but now she has persistent and recurrent pain.  She says is hard for her to groom herself including toileting and she would like to proceed with surgery  Past Medical History:  Diagnosis Date  . Anxiety   . Arrhythmia   . Asthma   . Bipolar disorder (HCC)   . Depression   . Headache(784.0)   . History of borderline personality disorder   . Menorrhagia   . Migraine headache with aura 11/17/2012   Associated with menses   . Obsessive-compulsive disorder   . Oppositional defiant disorder   . Psychosis (HCC)   . PTSD (post-traumatic stress disorder)    No past surgical history on file.  BP (!) 144/101   Pulse 90   Ht 5\' 1"  (1.549 m)   Wt 298 lb 6 oz (135.3 kg)   BMI 56.38 kg/m   She does have severe obesity  She is otherwise normally groomed normal development  She is oriented x3  Mood is pleasant affect is normal  No gait abnormalities  She has a normal pulse in the right upper extremity temperature is normal no edema no swelling in no varicosities  She has no lymphadenopathy in that extremity  She has normal sensation in the hand  Her reflexes are normal  Coordination of the hand is normal  She is tender over the first extensor compartment normal flexion extension painful ulnar deviation stable wrist strength is normal skin is intact  Encounter Diagnosis  Name Primary?  Quervain's disease (radial styloid tenosynovitis) Yes    The procedure has been fully reviewed with the patient; The risks and benefits of surgery have been discussed and explained and understood. Alternative treatment has also been reviewed, questions were encouraged and  answered. The postoperative plan is also been reviewed.

## 2020-12-06 ENCOUNTER — Telehealth: Payer: Self-pay | Admitting: Radiology

## 2020-12-06 NOTE — Telephone Encounter (Signed)
I called [facility] today and authorization not required for CPT 25000 reference number for the call is Paula Kane. 406pm 12/06/20

## 2020-12-10 NOTE — Patient Instructions (Signed)
Paula Kane  12/10/2020     @PREFPERIOPPHARMACY @   Your procedure is scheduled on  12/18/2020   Report to 12/20/2020 at  0815  A.M.   Call this number if you have problems the morning of surgery:  [phone]   Remember:  Do not eat or drink after midnight.                       Take these medicines the morning of surgery with A SIP OF WATER  Buspar, zyrtec, zoloft.   Place clean sheets on your bed the night before your procedure and DO NOT sleep with pets this night.  Shower with CHG the night before and the morning of your procedure. DO NOT put CHG on your face, hair or genitals.  After each shower, dry off with a clean towel, put on clean, comfortable clothes and brush your teeth.      Do not wear jewelry, make-up or nail polish.  Do not wear lotions, powders, or perfumes, or deodorant.  Do not shave 48 hours prior to surgery.  Men may shave face and neck.  Do not bring valuables to the hospital.  [facility] is not responsible for any belongings or valuables.   Contacts, dentures or bridgework may not be worn into surgery.  Leave your suitcase in the car.  After surgery it may be brought to your room.  For patients admitted to the hospital, discharge time will be determined by your treatment team.  Patients discharged the day of surgery will not be allowed to drive home and must have someone with them for 24 hours.     Special instructions:  DO NOT smoke tobacco or vape for 24 hours before your procedure.  Please read over the following fact sheets that you were given. Coughing and Deep Breathing, Surgical Site Infection Prevention, Anesthesia Post-op Instructions and Care and Recovery After Surgery       Incision Care, Adult An incision is a cut that a doctor makes in your skin for surgery. Most times, these cuts are closed after surgery. Your cut from surgery may be closed with:  Stitches (sutures).  Staples.  Skin glue.  Skin tape  (adhesive) strips. You may need to return to your doctor to have stitches or staples taken out. This may happen many days or many weeks after your surgery. You need to take good care of your cut so it does not get infected. Follow instructions from your doctor about how to care for your cut. Supplies needed:  Soap and water.  A clean hand towel.  Wound cleanser.  A clean bandage (dressing), if needed.  Cream or ointment, if told by your doctor.  Clean gauze. How to care for your cut from surgery Cleaning your cut Ask your doctor how to clean your cut. You may need to:  Use mild soap and water, or a wound cleanser.  Use a clean gauze to pat your cut dry after you clean it. Changing your bandage  Wash your hands with soap and water for at least 20 seconds before and after you change your bandage. If you cannot use soap and water, use hand sanitizer.  Change your bandage as told by your doctor.  Leave stitches, staples, skin glue, or skin tape strips in place. They may need to stay in place for 2 weeks or longer. If tape strips get loose and curl up, you may  trim the loose edges. Do not remove tape strips completely unless your doctor says it is okay.  Put a cream or ointment on your cut. Do this only as told.  Cover your cut with a clean bandage.  Ask your doctor when you can leave your cut uncovered. Checking for infection Check your cut area every day for signs of infection. Check for:  More redness, swelling, or pain.  More fluid or blood.  Warmth.  Pus or a bad smell.   Follow these instructions at home Medicines  Take over-the-counter and prescription medicines only as told by your doctor.  If you were prescribed an antibiotic medicine, cream, or ointment, use it as told by your doctor. Do not stop using the antibiotic even if your condition improves. Eating and drinking  Eat foods that have a lot of certain nutrients, such as protein, vitamin A, and vitamin C.  These foods help your cut heal. ? Foods rich in protein include meat, fish, eggs, dairy, beans, and nuts. ? Foods rich in vitamin A include carrots and dark green, leafy vegetables. ? Foods rich in vitamin C include citrus fruits, tomatoes, broccoli, and peppers.  Drink enough fluid to keep your pee (urine) pale yellow. General instructions  Do not take baths, swim, use a hot tub, or put your cut underwater until your doctor approves. Ask your doctor if you may take showers. You may only be allowed to take sponge baths.  Limit movement around your cut. This helps with healing. ? Try not to strain, lift, or exercise for the first 2 weeks, or for as long as told by your doctor. ? Return to your normal activities as told by your doctor. Ask your doctor what activities are safe for you.  Do not scratch or pick at your cut. Keep it covered as told by your doctor.  Protect your cut from the sun when you are outside for the first 6 months, or for as long as told by your doctor. Cover up the scar area or put on sunscreen that has an SPF of at least 30.  Do not use any products that contain nicotine or tobacco, such as cigarettes, e-cigarettes, and chewing tobacco. These can delay cut healing after surgery. If you need help quitting, ask your doctor.  Keep all follow-up visits as told by your doctor. This is important.   Contact a doctor if:  You have any of these signs of infection around your cut: ? More redness, swelling, or pain. ? More fluid or blood. ? Warmth. ? Pus or a bad smell.  You have a fever.  You feel like you may vomit (nauseous).  You vomit.  You are dizzy.  Your stitches, staples, skin glue, or tape strips come undone. Get help right away if:  Your cut has a red streak coming from it.  Your cut bleeds through your bandage, and bleeding does not stop with gentle pressure.  Your cut opens up and comes apart.  Your body reacts very badly to an infection. This may  include: ? A fever, chills, or feeling cold. ? Feeling mixed up, worried, or nervous. ? Very bad pain. ? Trouble breathing. ? A fast heartbeat. ? Clammy or sweaty skin. ? A rash. These symptoms may be an emergency. Do not wait to see if the symptoms will go away. Get medical help right away. Call your local emergency services (911 in the [place] Do not drive yourself to the hospital. Summary  Follow instructions  from your doctor about how to care for your cut from surgery.  Wash your hands with soap and water for at least 20 seconds before and after you change your bandage. If you cannot use soap and water, use hand sanitizer.  Check your cut area every day for signs of infection.  Keep all follow-up visits as told by your doctor. This is important. This information is not intended to replace advice given to you by your health care provider. Make sure you discuss any questions you have with your health care provider. Document Revised: 06/08/2019 Document Reviewed: 06/08/2019 Elsevier Patient Education  2021 [employer] General Anesthesia, Adult, Care After This sheet gives you information about how to care for yourself after your procedure. Your health care provider may also give you more specific instructions. If you have problems or questions, contact your health care provider. What can I expect after the procedure? After the procedure, the following side effects are common:  Pain or discomfort at the IV site.  Nausea.  Vomiting.  Sore throat.  Trouble concentrating.  Feeling cold or chills.  Feeling weak or tired.  Sleepiness and fatigue.  Soreness and body aches. These side effects can affect parts of the body that were not involved in surgery. Follow these instructions at home: For the time period you were told by your health care provider:  Rest.  Do not participate in activities where you could fall or become injured.  Do not drive or use machinery.  Do  not drink alcohol.  Do not take sleeping pills or medicines that cause drowsiness.  Do not make important decisions or sign legal documents.  Do not take care of children on your own.   Eating and drinking  Follow any instructions from your health care provider about eating or drinking restrictions.  When you feel hungry, start by eating small amounts of foods that are soft and easy to digest (bland), such as toast. Gradually return to your regular diet.  Drink enough fluid to keep your urine pale yellow.  If you vomit, rehydrate by drinking water, juice, or clear broth. General instructions  If you have sleep apnea, surgery and certain medicines can increase your risk for breathing problems. Follow instructions from your health care provider about wearing your sleep device: ? Anytime you are sleeping, including during daytime naps. ? While taking prescription pain medicines, sleeping medicines, or medicines that make you drowsy.  Have a responsible adult stay with you for the time you are told. It is important to have someone help care for you until you are awake and alert.  Return to your normal activities as told by your health care provider. Ask your health care provider what activities are safe for you.  Take over-the-counter and prescription medicines only as told by your health care provider.  If you smoke, do not smoke without supervision.  Keep all follow-up visits as told by your health care provider. This is important. Contact a health care provider if:  You have nausea or vomiting that does not get better with medicine.  You cannot eat or drink without vomiting.  You have pain that does not get better with medicine.  You are unable to pass urine.  You develop a skin rash.  You have a fever.  You have redness around your IV site that gets worse. Get help right away if:  You have difficulty breathing.  You have chest pain.  You have blood in your urine or  stool, or you vomit blood. Summary  After the procedure, it is common to have a sore throat or nausea. It is also common to feel tired.  Have a responsible adult stay with you for the time you are told. It is important to have someone help care for you until you are awake and alert.  When you feel hungry, start by eating small amounts of foods that are soft and easy to digest (bland), such as toast. Gradually return to your regular diet.  Drink enough fluid to keep your urine pale yellow.  Return to your normal activities as told by your health care provider. Ask your health care provider what activities are safe for you. This information is not intended to replace advice given to you by your health care provider. Make sure you discuss any questions you have with your health care provider. Document Revised: 05/03/2020 Document Reviewed: 12/01/2019 Elsevier Patient Education  2021 [employer].

## 2020-12-17 ENCOUNTER — Encounter (HOSPITAL_COMMUNITY): Payer: Self-pay

## 2020-12-17 ENCOUNTER — Other Ambulatory Visit (HOSPITAL_COMMUNITY)
Admission: RE | Admit: 2020-12-17 | Discharge: 2020-12-17 | Disposition: A | Discharge status: Home / Self Care | Payer: 59 | Source: Ambulatory Visit | Attending: Orthopedic Surgery | Admitting: Orthopedic Surgery

## 2020-12-17 ENCOUNTER — Other Ambulatory Visit: Payer: Self-pay

## 2020-12-17 ENCOUNTER — Encounter (HOSPITAL_COMMUNITY)
Admission: RE | Admit: 2020-12-17 | Discharge: 2020-12-17 | Disposition: A | Discharge status: Home / Self Care | Payer: 59 | Source: Ambulatory Visit | Attending: Orthopedic Surgery | Admitting: Orthopedic Surgery

## 2020-12-17 DIAGNOSIS — Z01818 Encounter for other preprocedural examination: Secondary | ICD-10-CM | POA: Diagnosis not present

## 2020-12-17 DIAGNOSIS — Z20822 Contact with and (suspected) exposure to covid-19: Secondary | ICD-10-CM | POA: Diagnosis not present

## 2020-12-17 LAB — CBC WITH DIFFERENTIAL/PLATELET
Abs Immature Granulocytes: 0.03 10*3/uL (ref 0.00–0.07)
Basophils Absolute: 0 10*3/uL (ref 0.0–0.1)
Basophils Relative: 0 %
Eosinophils Absolute: 0.1 10*3/uL (ref 0.0–0.5)
Eosinophils Relative: 2 %
HCT: 44 % (ref 36.0–46.0)
Hemoglobin: 13.9 g/dL (ref 12.0–15.0)
Immature Granulocytes: 1 %
Lymphocytes Relative: 32 %
Lymphs Abs: 1.8 10*3/uL (ref 0.7–4.0)
MCH: 28.5 pg (ref 26.0–34.0)
MCHC: 31.6 g/dL (ref 30.0–36.0)
MCV: 90.2 fL (ref 80.0–100.0)
Monocytes Absolute: 0.4 10*3/uL (ref 0.1–1.0)
Monocytes Relative: 7 %
Neutro Abs: 3.3 10*3/uL (ref 1.7–7.7)
Neutrophils Relative %: 58 %
Platelets: 229 10*3/uL (ref 150–400)
RBC: 4.88 MIL/uL (ref 3.87–5.11)
RDW: 12.9 % (ref 11.5–15.5)
WBC: 5.6 10*3/uL (ref 4.0–10.5)
nRBC: 0 % (ref 0.0–0.2)

## 2020-12-17 LAB — BASIC METABOLIC PANEL
Anion gap: 11 (ref 5–15)
BUN: 15 mg/dL (ref 6–20)
CO2: 22 mmol/L (ref 22–32)
Calcium: 9.6 mg/dL (ref 8.9–10.3)
Chloride: 106 mmol/L (ref 98–111)
Creatinine, Ser: 0.89 mg/dL (ref 0.44–1.00)
GFR, Estimated: >60 mL/min (ref >60)
Glucose, Bld: 103 mg/dL — ABNORMAL HIGH (ref 70–99)
Potassium: 4.3 mmol/L (ref 3.5–5.1)
Sodium: 139 mmol/L (ref 135–145)

## 2020-12-17 LAB — RAPID URINE DRUG SCREEN, HOSP PERFORMED
Amphetamines: NOT DETECTED
Barbiturates: NOT DETECTED
Benzodiazepines: NOT DETECTED
Cocaine: NOT DETECTED
Opiates: NOT DETECTED
Tetrahydrocannabinol: NOT DETECTED

## 2020-12-17 LAB — HCG, SERUM, QUALITATIVE: Preg, Serum: NEGATIVE

## 2020-12-17 NOTE — H&P (Addendum)
Chief Complaint  Patient presents with  . Wrist Pain    R/       Encounter Diagnosis  Name Primary?  Tommi Rumps Quervain's disease (radial styloid tenosynovitis) Yes    30 year old female history of de Quervain's syndrome treated nonoperatively with bracing and injection in anti-inflammatories we were getting some improvement but now she has persistent and recurrent pain.  She says is hard for her to groom herself including toileting and she would like to proceed with surgery      Past Medical History:  Diagnosis Date  . Anxiety   . Arrhythmia   . Asthma   . Bipolar disorder (HCC)   . Depression   . Headache(784.0)   . History of borderline personality disorder   . Menorrhagia   . Migraine headache with aura 11/17/2012   Associated with menses   . Obsessive-compulsive disorder   . Oppositional defiant disorder   . Psychosis (HCC)   . PTSD (post-traumatic stress disorder)    No past surgical history on file.  BP (!) 144/101   Pulse 90   Ht 5\' 1"  (1.549 m)   Wt 298 lb 6 oz (135.3 kg)   BMI 56.38 kg/m   She does have severe obesity  She is otherwise normally groomed normal development  She is oriented x3  Mood is pleasant affect is normal  No gait abnormalities  She has a normal pulse in the right upper extremity temperature is normal no edema no swelling in no varicosities  She has no lymphadenopathy in that extremity  She has normal sensation in the hand  Her reflexes are normal  Coordination of the hand is normal  She is tender over the first extensor compartment normal flexion extension painful ulnar deviation stable wrist strength is normal skin is intact      Encounter Diagnosis  Name Primary?  Quervain's disease (radial styloid tenosynovitis) Yes   RIGHT WRIST: 1ST EXTENSOR COMPARTMENT RELEASE   The procedure has been fully reviewed with the patient; The risks and benefits of surgery have been discussed and  explained and understood. Alternative treatment has also been reviewed, questions were encouraged and answered. The postoperative plan is also been reviewed.

## 2020-12-17 NOTE — Progress Notes (Signed)
No need for Covid test. Patient tested positive in the middle of February.

## 2020-12-17 NOTE — Progress Notes (Signed)
Patient's previous test was a at home self test.(positive)  Patient tried to report the + test to the [facility]. But they said they couldn't record an at home test. Patient took 3 or 4 more test until she test negative in March.

## 2020-12-17 NOTE — Progress Notes (Signed)
Please refer to 12/17/20 at 8:30 note. Patient came back early afternoon, swabbed pt. And took specimen to lab. Patient must have a documented Covid test, before she can have her surgery.

## 2020-12-18 ENCOUNTER — Ambulatory Visit (HOSPITAL_COMMUNITY): Payer: 59 | Admitting: Certified Registered"

## 2020-12-18 ENCOUNTER — Ambulatory Visit (HOSPITAL_COMMUNITY)
Admission: RE | Admit: 2020-12-18 | Discharge: 2020-12-18 | Disposition: A | Discharge status: Home / Self Care | Payer: 59 | Attending: Orthopedic Surgery | Admitting: Orthopedic Surgery

## 2020-12-18 ENCOUNTER — Encounter (HOSPITAL_COMMUNITY)
Admission: RE | Disposition: A | Discharge status: Home / Self Care | Payer: Self-pay | Source: Home / Self Care | Attending: Orthopedic Surgery

## 2020-12-18 ENCOUNTER — Encounter (HOSPITAL_COMMUNITY): Payer: Self-pay | Admitting: Orthopedic Surgery

## 2020-12-18 DIAGNOSIS — M654 Radial styloid tenosynovitis [de Quervain]: Secondary | ICD-10-CM

## 2020-12-18 HISTORY — PX: DORSAL COMPARTMENT RELEASE: SHX5039

## 2020-12-18 LAB — SARS CORONAVIRUS 2 (TAT 6-24 HRS): SARS Coronavirus 2: NEGATIVE

## 2020-12-18 SURGERY — RELEASE, FIRST DORSAL COMPARTMENT, HAND
Anesthesia: General | Site: Hand | Laterality: Right

## 2020-12-18 MED ORDER — BUPIVACAINE HCL (PF) 0.5 % IJ SOLN
INTRAMUSCULAR | Status: DC | PRN
  Administered 2020-12-18: 20 mL

## 2020-12-18 MED ORDER — PROPOFOL 10 MG/ML IV BOLUS
INTRAVENOUS | Status: AC
  Filled 2020-12-18: qty 20

## 2020-12-18 MED ORDER — ONDANSETRON HCL 4 MG/2ML IJ SOLN
INTRAMUSCULAR | Status: AC
  Filled 2020-12-18: qty 4

## 2020-12-18 MED ORDER — LIDOCAINE HCL (CARDIAC) PF 50 MG/5ML IV SOSY
PREFILLED_SYRINGE | INTRAVENOUS | Status: DC | PRN
  Administered 2020-12-18: 100 mg via INTRAVENOUS

## 2020-12-18 MED ORDER — CHLORHEXIDINE GLUCONATE 0.12 % MT SOLN
15.0000 mL | Freq: Once | OROMUCOSAL | Status: AC
  Administered 2020-12-18: 15 mL via OROMUCOSAL

## 2020-12-18 MED ORDER — ACETAMINOPHEN 500 MG PO TABS: 500.0000 mg | ORAL_TABLET | Freq: Four times a day (QID) | ORAL | 2 refills | Status: AC | PRN

## 2020-12-18 MED ORDER — LACTATED RINGERS IV SOLN: INTRAVENOUS | Status: DC

## 2020-12-18 MED ORDER — BUPIVACAINE HCL (PF) 0.5 % IJ SOLN
INTRAMUSCULAR | Status: AC
  Filled 2020-12-18: qty 30

## 2020-12-18 MED ORDER — ONDANSETRON HCL 4 MG/2ML IJ SOLN: 4.0000 mg | Freq: Once | INTRAMUSCULAR | Status: DC | PRN

## 2020-12-18 MED ORDER — CEFAZOLIN IN SODIUM CHLORIDE 3-0.9 GM/100ML-% IV SOLN
3.0000 g | INTRAVENOUS | Status: AC
  Administered 2020-12-18: 3 g via INTRAVENOUS
  Filled 2020-12-18: qty 100

## 2020-12-18 MED ORDER — IBUPROFEN 400 MG PO TABS
400.0000 mg | ORAL_TABLET | Freq: Once | ORAL | Status: AC
  Administered 2020-12-18: 400 mg via ORAL
  Filled 2020-12-18: qty 1

## 2020-12-18 MED ORDER — 0.9 % SODIUM CHLORIDE (POUR BTL) OPTIME
TOPICAL | Status: DC | PRN
  Administered 2020-12-18: 1000 mL

## 2020-12-18 MED ORDER — KETOROLAC TROMETHAMINE 30 MG/ML IJ SOLN
INTRAMUSCULAR | Status: AC
  Filled 2020-12-18: qty 1

## 2020-12-18 MED ORDER — MIDAZOLAM HCL 2 MG/2ML IJ SOLN
INTRAMUSCULAR | Status: AC
  Filled 2020-12-18: qty 2

## 2020-12-18 MED ORDER — ORAL CARE MOUTH RINSE: 15.0000 mL | Freq: Once | OROMUCOSAL | Status: AC

## 2020-12-18 MED ORDER — IBUPROFEN 800 MG PO TABS: 800.0000 mg | ORAL_TABLET | Freq: Three times a day (TID) | ORAL | 1 refills | Status: DC | PRN

## 2020-12-18 MED ORDER — ONDANSETRON HCL 4 MG/2ML IJ SOLN
INTRAMUSCULAR | Status: DC | PRN
  Administered 2020-12-18: 4 mg via INTRAVENOUS

## 2020-12-18 MED ORDER — FENTANYL CITRATE (PF) 100 MCG/2ML IJ SOLN
INTRAMUSCULAR | Status: AC
  Filled 2020-12-18: qty 2

## 2020-12-18 MED ORDER — PROPOFOL 10 MG/ML IV BOLUS
INTRAVENOUS | Status: DC | PRN
  Administered 2020-12-18: 20 mg via INTRAVENOUS
  Administered 2020-12-18 (×2): 30 mg via INTRAVENOUS
  Administered 2020-12-18: 50 mg via INTRAVENOUS
  Administered 2020-12-18: 250 mg via INTRAVENOUS
  Administered 2020-12-18: 20 mg via INTRAVENOUS

## 2020-12-18 MED ORDER — ACETAMINOPHEN 10 MG/ML IV SOLN: 1000.0000 mg | Freq: Once | INTRAVENOUS | Status: DC | PRN

## 2020-12-18 MED ORDER — KETOROLAC TROMETHAMINE 30 MG/ML IJ SOLN
INTRAMUSCULAR | Status: DC | PRN
  Administered 2020-12-18: 30 mg via INTRAVENOUS

## 2020-12-18 MED ORDER — ACETAMINOPHEN 500 MG PO TABS
500.0000 mg | ORAL_TABLET | Freq: Once | ORAL | Status: AC
  Administered 2020-12-18: 500 mg via ORAL
  Filled 2020-12-18: qty 1

## 2020-12-18 MED ORDER — MIDAZOLAM HCL 2 MG/2ML IJ SOLN
INTRAMUSCULAR | Status: DC | PRN
  Administered 2020-12-18: 2 mg via INTRAVENOUS

## 2020-12-18 SURGICAL SUPPLY — 41 items
APL PRP STRL LF DISP 70% ISPRP (MISCELLANEOUS) ×2
APL SKNCLS STERI-STRIP NONHPOA (GAUZE/BANDAGES/DRESSINGS) ×1
BANDAGE ESMARK 4X12 BL STRL LF (DISPOSABLE) ×1 IMPLANT
BENZOIN TINCTURE PRP APPL 2/3 (GAUZE/BANDAGES/DRESSINGS) ×2 IMPLANT
BLADE SURG 15 STRL LF DISP TIS (BLADE) ×1 IMPLANT
BLADE SURG 15 STRL SS (BLADE) ×2
BNDG CMPR 12X4 ELC STRL LF (DISPOSABLE) ×1
BNDG CMPR STD VLCR NS LF 5.8X3 (GAUZE/BANDAGES/DRESSINGS) ×1
BNDG COHESIVE 4X5 TAN STRL (GAUZE/BANDAGES/DRESSINGS) ×2 IMPLANT
BNDG ELASTIC 3X5.8 VLCR NS LF (GAUZE/BANDAGES/DRESSINGS) ×2 IMPLANT
BNDG ESMARK 4X12 BLUE STRL LF (DISPOSABLE) ×2
BNDG GAUZE ELAST 4 BULKY (GAUZE/BANDAGES/DRESSINGS) ×2 IMPLANT
CHLORAPREP W/TINT 26 (MISCELLANEOUS) ×3 IMPLANT
CLOTH BEACON ORANGE TIMEOUT ST (SAFETY) ×2 IMPLANT
COVER LIGHT HANDLE STERIS (MISCELLANEOUS) ×4 IMPLANT
COVER WAND RF STERILE (DRAPES) ×4 IMPLANT
CUFF TOURN SGL QUICK 18X4 (TOURNIQUET CUFF) ×2 IMPLANT
DECANTER SPIKE VIAL GLASS SM (MISCELLANEOUS) ×2 IMPLANT
DRAPE HALF SHEET 40X57 (DRAPES) ×2 IMPLANT
ELECT REM PT RETURN 9FT ADLT (ELECTROSURGICAL) ×2
ELECTRODE REM PT RTRN 9FT ADLT (ELECTROSURGICAL) ×1 IMPLANT
GAUZE SPONGE 4X4 12PLY STRL (GAUZE/BANDAGES/DRESSINGS) ×2 IMPLANT
GAUZE XEROFORM 1X8 LF (GAUZE/BANDAGES/DRESSINGS) ×2 IMPLANT
GLOVE SKINSENSE NS SZ8.0 LF (GLOVE) ×1
GLOVE SKINSENSE STRL SZ8.0 LF (GLOVE) ×1 IMPLANT
GLOVE SS N UNI LF 8.5 STRL (GLOVE) ×2 IMPLANT
GLOVE SURG UNDER POLY LF SZ7 (GLOVE) ×4 IMPLANT
GOWN STRL REUS W/TWL LRG LVL3 (GOWN DISPOSABLE) ×2 IMPLANT
GOWN STRL REUS W/TWL XL LVL3 (GOWN DISPOSABLE) ×2 IMPLANT
KIT TURNOVER KIT A (KITS) ×2 IMPLANT
MANIFOLD NEPTUNE II (INSTRUMENTS) ×2 IMPLANT
NEEDLE HYPO 21X1.5 SAFETY (NEEDLE) ×2 IMPLANT
NS IRRIG 1000ML POUR BTL (IV SOLUTION) ×2 IMPLANT
PACK BASIC LIMB (CUSTOM PROCEDURE TRAY) ×2 IMPLANT
PAD ARMBOARD 7.5X6 YLW CONV (MISCELLANEOUS) ×2 IMPLANT
SET BASIN LINEN APH (SET/KITS/TRAYS/PACK) ×2 IMPLANT
STRIP CLOSURE SKIN 1/2X4 (GAUZE/BANDAGES/DRESSINGS) ×2 IMPLANT
SUT ETHILON 3 0 FSL (SUTURE) ×1 IMPLANT
SUT MON AB 2-0 SH 27 (SUTURE) ×2
SUT MON AB 2-0 SH27 (SUTURE) ×1 IMPLANT
SYR CONTROL 10ML LL (SYRINGE) ×2 IMPLANT

## 2020-12-18 NOTE — Interval H&P Note (Signed)
History and Physical Interval Note:  12/18/2020 10:09 AM  Paula Kane  has presented today for surgery, with the diagnosis of Right Dequervain release.  The various methods of treatment have been discussed with the patient and family. After consideration of risks, benefits and other options for treatment, the patient has consented to  Procedure(s): RIGHT HAND DEQUERVAIN'S RELEASE (Right) as a surgical intervention.  The patient's history has been reviewed, patient examined, no change in status, stable for surgery.  I have reviewed the patient's chart and labs.  Questions were answered to the patient's satisfaction.     Fuller Canada

## 2020-12-18 NOTE — Brief Op Note (Signed)
12/18/2020  11:11 AM  PATIENT:  Paula Kane  30 y.o. female  PRE-OPERATIVE DIAGNOSIS:  Right Dequervain's Syndrome  POST-OPERATIVE DIAGNOSIS:  Right Dequervain's Syndrome  PROCEDURE:  Procedure(s): RIGHT HAND DEQUERVAIN'S RELEASE (Right)  SURGEON:  Surgeon(s) and Role:    Vickki Hearing, MD - Primary  PHYSICIAN ASSISTANT:   ASSISTANTS: none   ANESTHESIA:   general  EBL:  0 mL   BLOOD ADMINISTERED:none  DRAINS: none   LOCAL MEDICATIONS USED:  MARCAINE    and Amount: 20 ml  SPECIMEN:  No Specimen  DISPOSITION OF SPECIMEN:  N/A  COUNTS:  YES  TOURNIQUET:   Total Tourniquet Time Documented: Upper Arm (Right) - 26 minutes Total: Upper Arm (Right) - 26 minutes   DICTATION: .Paula Kane Dictation  PLAN OF CARE: Discharge to home after PACU  PATIENT DISPOSITION:  PACU - hemodynamically stable.   Delay start of Pharmacological VTE agent (>24hrs) due to surgical blood loss or risk of bleeding: not applicable  Surgical dictation  Paula Kane was seen in the preop.  The surgical site was confirmed as the right wrist and marked she was taken to the operating room after being cleared for surgery.  She had general anesthesia she was in supine position a tourniquet was placed on the right arm  After sterile prep and drape and timeout a longitudinal incision was made over the first extensor compartment subcutaneous tissue was divided bluntly and the branch of the radial sensory nerve was protected with a retractor gently  Further dissection was carried down to the first extensor compartment which was released sharply.  There was a an accessory compartment which was also released.  Once full release was performed the wound was irrigated and closed with interrupted 2-0 Monocryl and running 2-0 Monocryl along with Steri-Strips and benzoin  Sterile dressing was applied tourniquet was released color came back to the hand good capillary refill and color  Patient was  extubated taken recovery room in stable condition

## 2020-12-18 NOTE — Anesthesia Procedure Notes (Signed)
Procedure Name: LMA Insertion Date/Time: 12/18/2020 10:23 AM Performed by: Franco Nones, CRNA Pre-anesthesia Checklist: Patient identified, Patient being monitored, Emergency Drugs available, Timeout performed and Suction available Patient Re-evaluated:Patient Re-evaluated prior to induction Oxygen Delivery Method: Circle System Utilized Preoxygenation: Pre-oxygenation with 100% oxygen Induction Type: IV induction LMA: LMA inserted LMA Size: 3.0 Number of attempts: 1 Placement Confirmation: positive ETCO2 and breath sounds checked- equal and bilateral Tube secured with: Tape Dental Injury: Teeth and Oropharynx as per pre-operative assessment

## 2020-12-18 NOTE — Brief Op Note (Signed)
12/18/2020  11:13 AM  PATIENT:  Paula Kane  30 y.o. female  PRE-OPERATIVE DIAGNOSIS:  Right Dequervain's Syndrome  POST-OPERATIVE DIAGNOSIS:  Right Dequervain's Syndrome  PROCEDURE:  Procedure(s): RIGHT HAND DEQUERVAIN'S RELEASE (Right)  SURGEON:  Surgeon(s) and Role:    * Reuel Lamadrid E, MD - Primary  PHYSICIAN ASSISTANT:   ASSISTANTS: none   ANESTHESIA:   general  EBL:  0 mL   BLOOD ADMINISTERED:none  DRAINS: none   LOCAL MEDICATIONS USED:  MARCAINE    and Amount: 20 ml  SPECIMEN:  No Specimen  DISPOSITION OF SPECIMEN:  N/A  COUNTS:  YES  TOURNIQUET:   Total Tourniquet Time Documented: Upper Arm (Right) - 26 minutes Total: Upper Arm (Right) - 26 minutes   DICTATION: .Dragon Dictation  PLAN OF CARE: Discharge to home after PACU  PATIENT DISPOSITION:  PACU - hemodynamically stable.   Delay start of Pharmacological VTE agent (>24hrs) due to surgical blood loss or risk of bleeding: not applicable  Surgical dictation  Paula Kane was seen in the preop.  The surgical site was confirmed as the right wrist and marked she was taken to the operating room after being cleared for surgery.  She had general anesthesia she was in supine position a tourniquet was placed on the right arm  After sterile prep and drape and timeout a longitudinal incision was made over the first extensor compartment subcutaneous tissue was divided bluntly and the branch of the radial sensory nerve was protected with a retractor gently  Further dissection was carried down to the first extensor compartment which was released sharply.  There was a an accessory compartment which was also released.  Once full release was performed the wound was irrigated and closed with interrupted 2-0 Monocryl and running 2-0 Monocryl along with Steri-Strips and benzoin  Sterile dressing was applied tourniquet was released color came back to the hand good capillary refill and color  Patient was  extubated taken recovery room in stable condition  

## 2020-12-18 NOTE — Transfer of Care (Signed)
Immediate Anesthesia Transfer of Care Note  Patient: Arie Sabina  Procedure(s) Performed: RIGHT HAND DEQUERVAIN'S RELEASE (Right Hand)  Patient Location: PACU  Anesthesia Type:General  Level of Consciousness: awake, alert  and patient cooperative  Airway & Oxygen Therapy: Patient Spontanous Breathing  Post-op Assessment: Report given to RN and Post -op Vital signs reviewed and stable  Post vital signs: Reviewed and stable  Last Vitals:  Vitals Value Taken Time  BP    Temp    Pulse    Resp 11 12/18/20 1120  SpO2    Vitals shown include unvalidated device data. SEE VITAL SIGN FLOW SHEET Last Pain:  Vitals:   12/18/20 0827  TempSrc: Oral  PainSc: 6       Patients Stated Pain Goal: 8 (12/18/20 0827)  Complications: No complications documented.

## 2020-12-18 NOTE — Op Note (Signed)
12/18/2020  11:13 AM  PATIENT:  Paula Kane  30 y.o. female  PRE-OPERATIVE DIAGNOSIS:  Right Dequervain's Syndrome  POST-OPERATIVE DIAGNOSIS:  Right Dequervain's Syndrome  PROCEDURE:  Procedure(s): RIGHT HAND DEQUERVAIN'S RELEASE (Right)  SURGEON:  Surgeon(s) and Role:    Paula Hearing, MD - Primary  PHYSICIAN ASSISTANT:   ASSISTANTS: none   ANESTHESIA:   general  EBL:  0 mL   BLOOD ADMINISTERED:none  DRAINS: none   LOCAL MEDICATIONS USED:  MARCAINE    and Amount: 20 ml  SPECIMEN:  No Specimen  DISPOSITION OF SPECIMEN:  N/A  COUNTS:  YES  TOURNIQUET:   Total Tourniquet Time Documented: Upper Arm (Right) - 26 minutes Total: Upper Arm (Right) - 26 minutes   DICTATION: .Reubin Milan Dictation  PLAN OF CARE: Discharge to home after PACU  PATIENT DISPOSITION:  PACU - hemodynamically stable.   Delay start of Pharmacological VTE agent (>24hrs) due to surgical blood loss or risk of bleeding: not applicable  Surgical dictation  Paula Kane was seen in the preop.  The surgical site was confirmed as the right wrist and marked she was taken to the operating room after being cleared for surgery.  She had general anesthesia she was in supine position a tourniquet was placed on the right arm  After sterile prep and drape and timeout a longitudinal incision was made over the first extensor compartment subcutaneous tissue was divided bluntly and the branch of the radial sensory nerve was protected with a retractor gently  Further dissection was carried down to the first extensor compartment which was released sharply.  There was a an accessory compartment which was also released.  Once full release was performed the wound was irrigated and closed with interrupted 2-0 Monocryl and running 2-0 Monocryl along with Steri-Strips and benzoin  Sterile dressing was applied tourniquet was released color came back to the hand good capillary refill and color  Patient was  extubated taken recovery room in stable condition

## 2020-12-18 NOTE — Anesthesia Postprocedure Evaluation (Signed)
Anesthesia Post Note  Patient: Paula Kane  Procedure(s) Performed: RIGHT HAND DEQUERVAIN'S RELEASE (Right Hand)  Patient location during evaluation: Phase II Anesthesia Type: General Level of consciousness: awake Pain management: pain level controlled Vital Signs Assessment: post-procedure vital signs reviewed and stable Respiratory status: spontaneous breathing and respiratory function stable Cardiovascular status: blood pressure returned to baseline and stable Postop Assessment: no headache and no apparent nausea or vomiting Anesthetic complications: no Comments: Late entry   No complications documented.   Last Vitals:  Vitals:   12/18/20 1149 12/18/20 1210  BP: 129/76 (!) 125/98  Pulse: 77 84  Resp: (!) 25 19  Temp:  36.5 C  SpO2: 99% 100%    Last Pain:  Vitals:   12/18/20 1210  TempSrc: Oral  PainSc: 3                  Windell Norfolk

## 2020-12-18 NOTE — Anesthesia Preprocedure Evaluation (Signed)
Anesthesia Evaluation  Patient identified by MRN, date of birth, ID band Patient awake    Reviewed: Allergy & Precautions, H&P , NPO status , Patient's Chart, lab work & pertinent test results, reviewed documented beta blocker date and time   Airway Mallampati: II  TM Distance: >3 FB Neck ROM: full    Dental no notable dental hx. (+) Teeth Intact   Pulmonary asthma ,    Pulmonary exam normal breath sounds clear to auscultation       Cardiovascular Exercise Tolerance: Good negative cardio ROS   Rhythm:regular Rate:Normal     Neuro/Psych  Headaches, PSYCHIATRIC DISORDERS Anxiety Depression Bipolar Disorder Schizophrenia    GI/Hepatic negative GI ROS, Neg liver ROS,   Endo/Other  Morbid obesity  Renal/GU negative Renal ROS  negative genitourinary   Musculoskeletal   Abdominal   Peds  Hematology negative hematology ROS (+)   Anesthesia Other Findings Patient has h/o opioid abuse - encourage local anesthetic infiltration at operative site and reduce/avoid opioids as much as possible intra-op.  Reproductive/Obstetrics negative OB ROS                             Anesthesia Physical Anesthesia Plan  ASA: III  Anesthesia Plan: General LMA   Post-op Pain Management:    Induction:   PONV Risk Score and Plan:   Airway Management Planned:   Additional Equipment:   Intra-op Plan:   Post-operative Plan:   Informed Consent: I have reviewed the patients History and Physical, chart, labs and discussed the procedure including the risks, benefits and alternatives for the proposed anesthesia with the patient or authorized representative who has indicated his/her understanding and acceptance.     Dental Advisory Given  Plan Discussed with: CRNA  Anesthesia Plan Comments:         Anesthesia Quick Evaluation

## 2020-12-19 ENCOUNTER — Encounter (HOSPITAL_COMMUNITY): Payer: Self-pay | Admitting: Orthopedic Surgery

## 2020-12-26 ENCOUNTER — Ambulatory Visit (INDEPENDENT_AMBULATORY_CARE_PROVIDER_SITE_OTHER): Payer: 59 | Admitting: Orthopedic Surgery

## 2020-12-26 ENCOUNTER — Encounter: Payer: Self-pay | Admitting: Orthopedic Surgery

## 2020-12-26 ENCOUNTER — Other Ambulatory Visit: Payer: Self-pay

## 2020-12-26 DIAGNOSIS — M654 Radial styloid tenosynovitis [de Quervain]: Secondary | ICD-10-CM

## 2020-12-26 NOTE — Progress Notes (Signed)
Chief Complaint  Patient presents with  . Post-op Follow-up    RIGHT HAND DEQUERVAIN'S RELEASE DOS 12/18/20    Her wound looks good.  She did get it wet but everything looks fine we put on new Steri-Strips put a plastic dressing over it and she can shower  Come back in a week to get the suture edges cut  Encounter Diagnosis  Name Primary?  Paula Kane Quervain's disease (radial styloid tenosynovitis) s/p release 12/18/20 Yes

## 2020-12-27 ENCOUNTER — Ambulatory Visit: Payer: 59 | Admitting: Orthopedic Surgery

## 2021-01-03 ENCOUNTER — Ambulatory Visit (INDEPENDENT_AMBULATORY_CARE_PROVIDER_SITE_OTHER): Payer: 59 | Admitting: Orthopedic Surgery

## 2021-01-03 ENCOUNTER — Encounter: Payer: Self-pay | Admitting: Orthopedic Surgery

## 2021-01-03 ENCOUNTER — Other Ambulatory Visit: Payer: Self-pay

## 2021-01-03 DIAGNOSIS — M654 Radial styloid tenosynovitis [de Quervain]: Secondary | ICD-10-CM

## 2021-01-03 NOTE — Progress Notes (Signed)
POST OP VISIT   Chief Complaint  Patient presents with  . Hand Pain    De-quervains Disease DOS 12/18/20./ Patient reports some numbness around the thumb.    No diagnosis found.   POV # 2  DOS 12/18/20  OP NOTE PRE-OPERATIVE DIAGNOSIS:  Right Dequervain's Syndrome  POST-OPERATIVE DIAGNOSIS:  Right Dequervain's Syndrome  PROCEDURE:  Procedure(s): RIGHT HAND DEQUERVAIN'S RELEASE (Right)  SURGEON:  Surgeon(s) and Role:    Vickki Hearing, MD - Primary  INFO: A little numbness in the base of the thumb which is intermittent comes and goes some days its completely normal some days not   OBJ: wound ck - clean, suture ends removed   ASSESSMENT AND PLAN   She has been discharged  Any problems she can let us know

## 2022-03-30 ENCOUNTER — Emergency Department (HOSPITAL_COMMUNITY)
Admission: EM | Admit: 2022-03-30 | Discharge: 2022-03-30 | Disposition: A | Discharge status: Home / Self Care | Payer: 59 | Attending: Emergency Medicine | Admitting: Emergency Medicine

## 2022-03-30 ENCOUNTER — Emergency Department (HOSPITAL_COMMUNITY): Payer: 59

## 2022-03-30 DIAGNOSIS — M545 Low back pain, unspecified: Secondary | ICD-10-CM | POA: Insufficient documentation

## 2022-03-30 DIAGNOSIS — M5440 Lumbago with sciatica, unspecified side: Secondary | ICD-10-CM

## 2022-03-30 LAB — PREGNANCY, URINE: Preg Test, Ur: NEGATIVE

## 2022-03-30 MED ORDER — IBUPROFEN 800 MG PO TABS: 800.0000 mg | ORAL_TABLET | Freq: Three times a day (TID) | ORAL | 2 refills | Status: AC | PRN

## 2022-03-30 MED ORDER — IBUPROFEN 800 MG PO TABS: 800.0000 mg | ORAL_TABLET | Freq: Three times a day (TID) | ORAL | 1 refills | Status: AC | PRN

## 2022-03-30 MED ORDER — KETOROLAC TROMETHAMINE 30 MG/ML IJ SOLN
60.0000 mg | Freq: Once | INTRAMUSCULAR | Status: AC
  Administered 2022-03-30: 60 mg via INTRAMUSCULAR
  Filled 2022-03-30: qty 2

## 2022-03-30 NOTE — ED Provider Notes (Signed)
[facility] EMERGENCY DEPARTMENT Provider Note   CSN: [id number] Arrival date & time: 03/30/22  1707     History {Add pertinent medical, surgical, social history, OB history to HPI:1} Chief Complaint  Patient presents with   Back Pain    Paula Kane is a 31 y.o. female.  Patient with back pain.  She complains of pain running down her left leg.  She has a history of obese   Back Pain      Home Medications Prior to Admission medications   Medication Sig Start Date End Date Taking? Authorizing Provider  ibuprofen (ADVIL) 800 MG tablet Take 1 tablet (800 mg total) by mouth every 8 (eight) hours as needed. 03/30/22  Yes Bethann Berkshire, MD  busPIRone (BUSPAR) 15 MG tablet Take 15 mg by mouth 2 (two) times daily.    [provider]  cetirizine (ZYRTEC) 10 MG tablet Take 10 mg by mouth in the morning.    [provider]  fexofenadine (ALLEGRA) 180 MG tablet Take 180 mg by mouth every evening.    [provider]  ibuprofen (ADVIL) 800 MG tablet Take 1 tablet (800 mg total) by mouth every 8 (eight) hours as needed. 03/30/22   Bethann Berkshire, MD  Melatonin 10 MG TABS Take 10 mg by mouth at bedtime.    [provider]  Multiple Vitamin (MULTIVITAMIN) tablet Take 1 tablet by mouth daily.    [provider]  norethindrone (MICRONOR) 0.35 MG tablet Take 1 tablet by mouth daily.    [provider]  prazosin (MINIPRESS) 2 MG capsule Take 2 mg by mouth at bedtime.    [provider]  QUEtiapine (SEROQUEL) 300 MG tablet Take 300 mg by mouth at bedtime.    [provider]  sertraline (ZOLOFT) 100 MG tablet Take 200 mg by mouth daily.    [provider]  calcium-vitamin D (OSCAL WITH D) 250-125 MG-UNIT per tablet Take 1 tablet by mouth 2 (two) times daily.  12/08/19  [provider]  lithium 300 MG tablet Take 2 tab twice a day 08/22/13 12/08/19  Myrlene Broker, MD  propranolol (INDERAL) 10 MG tablet Take 1  tablet (10 mg total) by mouth 3 (three) times daily. 08/22/13 12/08/19  Myrlene Broker, MD      Allergies    Hydrocodone, Sulfa antibiotics, Codeine, Lactose intolerance (gi), Lexapro [escitalopram oxalate], Soap, Prednisone, and Adhesive [tape]    Review of Systems   Review of Systems  Musculoskeletal:  Positive for back pain.    Physical Exam Updated Vital Signs BP (!) 151/90   Pulse 84   Temp 98.6 F (37 C) (Oral)   Resp 18   Ht 5\' 2"  (1.575 m)   Wt (!) 142.9 kg   SpO2 99%   BMI 57.61 kg/m  Physical Exam  ED Results / Procedures / Treatments   Labs (all labs ordered are listed, but only abnormal results are displayed) Labs Reviewed  PREGNANCY, URINE    EKG None  Radiology DG Lumbar Spine Complete  Result Date: 03/30/2022 CLINICAL DATA:  Chronic back pain over 15 years. EXAM: LUMBAR SPINE - COMPLETE 4+ VIEW COMPARISON:  11/29/2010 FINDINGS: There is no evidence of lumbar spine fracture. Alignment is normal. Intervertebral disc spaces are maintained. IMPRESSION: Negative. Electronically Signed   By: 12/01/2010 M.D.   On: 03/30/2022 20:24    Procedures Procedures  {Document cardiac monitor, telemetry assessment procedure when appropriate:1}  Medications Ordered in ED Medications  ketorolac (  TORADOL) 30 MG/ML injection 60 mg (60 mg Intramuscular Given 03/30/22 1853)    ED Course/ Medical Decision Making/ A&P                           Medical Decision Making Amount and/or Complexity of Data Reviewed Labs: ordered. Radiology: ordered.  Risk Prescription drug management.   Patient with back pain and sciatica pain.  She is given Motrin will follow-up with Ortho  {Document critical care time when appropriate:1} {Document review of labs and clinical decision tools ie heart score, Chads2Vasc2 etc:1}  {Document your independent review of radiology images, and any outside records:1} {Document your discussion with family members, caretakers, and with  consultants:1} {Document social determinants of health affecting pt's care:1} {Document your decision making why or why not admission, treatments were needed:1} Final Clinical Impression(s) / ED Diagnoses Final diagnoses:  None    Rx / DC Orders ED Discharge Orders          Ordered    ibuprofen (ADVIL) 800 MG tablet  Every 8 hours PRN        03/30/22 2305    ibuprofen (ADVIL) 800 MG tablet  Every 8 hours PRN        03/30/22 2305

## 2022-03-30 NOTE — ED Notes (Signed)
Pt d/c home per MD order. Discharge summary reviewed, pt verbalizes understanding. Off unit via WC- No s/s of acute distress noted. Reports mother is discharge ride home.

## 2022-03-30 NOTE — ED Notes (Signed)
Patient transported to X-ray 

## 2022-03-30 NOTE — ED Triage Notes (Signed)
Pt to ED via triage c/o chronic back pain over the past 15 years, over the past 2 years progressively worse, using a cane. Here today because pain is "worse than normal"

## 2022-03-30 NOTE — Discharge Instructions (Signed)
Follow-up with Dr. Romeo Apple or family doctor in a week or so for recheck

## 2022-04-16 DIAGNOSIS — M549 Dorsalgia, unspecified: Secondary | ICD-10-CM | POA: Insufficient documentation

## 2022-04-17 ENCOUNTER — Ambulatory Visit: Payer: 59 | Admitting: Orthopedic Surgery

## 2022-04-17 ENCOUNTER — Encounter: Payer: Self-pay | Admitting: Orthopedic Surgery

## 2022-04-17 VITALS — BP 154/91 | HR 106 | Ht 62.0 in | Wt 320.0 lb

## 2022-04-17 DIAGNOSIS — M5442 Lumbago with sciatica, left side: Secondary | ICD-10-CM

## 2022-04-17 DIAGNOSIS — G8929 Other chronic pain: Secondary | ICD-10-CM

## 2022-04-17 DIAGNOSIS — M545 Low back pain, unspecified: Secondary | ICD-10-CM | POA: Diagnosis not present

## 2022-04-17 MED ORDER — TIZANIDINE HCL 4 MG PO TABS: 4.0000 mg | ORAL_TABLET | Freq: Every day | ORAL | 1 refills | Status: AC

## 2022-04-17 NOTE — Progress Notes (Signed)
Chief Complaint  Patient presents with   Back Pain    Progressively getting worse and interfering with ADL's   31 year old female presents with a 10-year history of back pain worse over the last year.  She says now it is hard for her to walk and she cannot stand for long periods of time.  The pain starts in her lower back and radiates down her left leg.  Although she would not say it is numb and feels tingly like she feels when she gets a shot at the dentist  She had an x-ray this year was negative  She has had no prior treatment other than her ER visit where they gave her some Toradol which helped and she is on ibuprofen which also decreases the pain where she can function  BP (!) 154/91   Pulse (!) 106   Ht 5\' 2"  (1.575 m)   Wt (!) 320 lb (145.2 kg)   BMI 58.53 kg/m  Body mass index is 58.53 kg/m. The patient meets the AMA guidelines for Morbid (severe) obesity with a BMI > 40.0 and I have recommended weight loss.  She is also considering bariatric surgery  Examination she is tender in her lower back this is most notable at L5-S1 on the midline and then on the left side.  Her sensation is altered right to left with decreased sensation with pinwheel testing at L5 and L4 on the left reflexes are equal motor exam is normal she has pain in the left leg with straight leg raise on the left mild on the right  Imaging from the hospital images no fracture or dislocation no spondylolysis or listhesis  Recommend physical therapy, ibuprofen, tizanidine, MRI imaging  Encounter Diagnoses  Name Primary?   Lumbar pain Yes   Chronic left-sided low back pain with left-sided sciatica    Meds ordered this encounter  Medications   tiZANidine (ZANAFLEX) 4 MG tablet    Sig: Take 1 tablet (4 mg total) by mouth daily.    Dispense:  30 tablet    Refill:  1

## 2022-04-17 NOTE — Patient Instructions (Addendum)
While we are working on your approval for MRI please go ahead and call to schedule your appointment with [facility] within at least one (1) week.   Central Scheduling [phone]  Physical therapy has been ordered for you at [facility]. They should call you to schedule, [phone] is the phone number to call, if you want to call to schedule.

## 2022-04-24 ENCOUNTER — Telehealth: Payer: Self-pay | Admitting: Radiology

## 2022-04-24 NOTE — Telephone Encounter (Signed)
MRI denied, she needs physical therapy for 6 weeks first, order for physical therapy was already placed she states she will call to schedule the physical therapy

## 2022-04-27 ENCOUNTER — Other Ambulatory Visit: Payer: 59

## 2022-05-07 ENCOUNTER — Ambulatory Visit: Payer: 59 | Admitting: Orthopedic Surgery

## 2022-05-26 ENCOUNTER — Ambulatory Visit (HOSPITAL_COMMUNITY): Payer: 59 | Attending: Orthopedic Surgery | Admitting: Physical Therapy

## 2022-05-26 DIAGNOSIS — M545 Low back pain, unspecified: Secondary | ICD-10-CM | POA: Diagnosis not present

## 2022-05-26 DIAGNOSIS — M6281 Muscle weakness (generalized): Secondary | ICD-10-CM | POA: Diagnosis not present

## 2022-05-26 DIAGNOSIS — R262 Difficulty in walking, not elsewhere classified: Secondary | ICD-10-CM | POA: Diagnosis not present

## 2022-05-26 DIAGNOSIS — M5416 Radiculopathy, lumbar region: Secondary | ICD-10-CM | POA: Insufficient documentation

## 2022-05-26 NOTE — Therapy (Signed)
OUTPATIENT PHYSICAL THERAPY THORACOLUMBAR EVALUATION   Patient Name: Paula Kane MRN: [id number] DOB:[birth year], 31 y.o., female Today's Date: 05/26/2022   PT End of Session - 05/26/22 1636     Visit Number 1    Number of Visits 12    Date for PT Re-Evaluation 07/07/22    Authorization Type [employer]    Progress Note Due on Visit 10    PT Start Time 1550    PT Stop Time 1633    PT Time Calculation (min) 43 min    Activity Tolerance Patient limited by fatigue    Behavior During Therapy [facility] for tasks assessed/performed             Past Medical History:  Diagnosis Date   Anxiety    Arrhythmia    Asthma    Bipolar disorder (HCC)    Depression    Headache(784.0)    History of borderline personality disorder    Menorrhagia    Migraine headache with aura 11/17/2012   Associated with menses    Obsessive-compulsive disorder    Oppositional defiant disorder    Psychosis (HCC)    schizoaffective disorder   PTSD (post-traumatic stress disorder)    Past Surgical History:  Procedure Laterality Date   DORSAL COMPARTMENT RELEASE Right 12/18/2020   Procedure: RIGHT HAND DEQUERVAIN'S RELEASE;  Surgeon: Vickki Hearing, MD;  Location: [facility];  Service: Orthopedics;  Laterality: Right;   Patient Active Problem List   Diagnosis Date Noted   Back pain 04/16/2022   Suzette Battiest disease (radial styloid tenosynovitis)    Has a tremor 12/15/2012   Nausea alone 12/14/2012   Excessive menses 11/17/2012   Menses painful 11/17/2012   Migraine headache with aura 11/17/2012   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 Fuller Canada REFERRING  DIAG:  M54.50 (ICD-10-CM) - Lumbar pain  Rationale for Evaluation and Treatment Rehabilitation  THERAPY DIAG:  Difficulty in walking, lumbar radiculopathy, mm weakness  ONSET DATE: chronic   SUBJECTIVE:                                                                                                                                                                                           SUBJECTIVE STATEMENT: PT states that she has had back pain for 10 years, about 6 years ago she tripped and  fell and this increased her pain.  She is at the point that she has to use a cane to walk and if she goes any distance further than to a [employer] counter she needs a wheelchair.  Pt able to sit for an hour without increased pain, Stand for no more than 5 minutes, walks with an assistive device for less than five minutes. PERTINENT HISTORY:  31 year old female presents with a 10-year history of back pain worse over the last year. She says now it is hard for her to walk and she cannot stand for long periods of time. The pain starts in her lower back and radiates down her left leg.  PAIN:  Are you having pain? Yes: :"NPRS scale: 2/10 worst is an 8-9,"Pain location: low back  "Pain description: just hurts","Aggravating factors: walking ,"Relieving factors: self medicate   PRECAUTIONS: None  WEIGHT BEARING RESTRICTIONS No  FALLS:  Has patient fallen in last 6 months? No  LIVING ENVIRONMENT: Lives with: lives with their family Lives in: House/apartment Stairs: Yes: External: 40 steps; on right going up Has following equipment at home: Single point cane and Wheelchair (manual)  OCCUPATION: Pt works from home   PLOF: Independent with household mobility with device  PATIENT GOALS less pain    OBJECTIVE:   DIAGNOSTIC FINDINGS:  X-rays are (-)  PATIENT SURVEYS:  FOTO 29    COGNITION:  Overall cognitive status: Within functional limits for tasks assessed     SENSATION: WFL  MUSCLE  LENGTH: Hamstrings: Right 155 deg; Left 160 deg POSTURE: rounded shoulders, forward head, increased lumbar lordosis, and decreased thoracic kyphosis  LUMBAR ROM:   Active  A/PROM  eval  Flexion Fingers to mid calf, increased pain both going down and pulling up   Extension Normal  reps improve sx   Right lateral flexion   Left lateral flexion   Right rotation   Left rotation    (Blank rows = not tested)   LOWER EXTREMITY MMT:    MMT Right eval Left eval  Hip flexion 3+/5 4-/5  Hip extension 3-/5 3/5  Hip abduction 4/5 4/5  Hip adduction    Hip internal rotation    Hip external rotation    Knee flexion 3+/5 3+/5  Knee extension 4+/5 3-/5  Ankle dorsiflexion 3/5 3+/5  Ankle plantarflexion    Ankle inversion    Ankle eversion     (Blank rows = not tested)    FUNCTIONAL TESTS:  30 seconds chair stand test:  6 2 minute walk test: 38' with cane  Single leg stance:  RT:  15", Lt:  6"    TODAY'S TREATMENT  05/26/2022:  Evaluation; Sitting:  abdominal set x 10              Sit to stand x 5 Supine:  bridge x 10    PATIENT EDUCATION:  Education details: HEP Person educated: Patient Education method: Explanation Education comprehension: verbalized understanding and returned demonstration   HOME EXERCISE PROGRAM: [email or web address] Code: [email or web address] URL: [email or web address] Date: 05/26/2022 Prepared by: Virgina Organ  Exercises - Seated Transversus Abdominis Bracing  - 10 x daily - 7 x weekly - 1 sets - 10 reps - 3-5 seconds  hold - Sit to Stand  - 3 x daily - 7 x weekly - 1 sets - 5 reps - Supine Bridge  - 2 x daily - 7 x weekly - 1 sets - 10 reps - 3-5 seconds hold  ASSESSMENT:  CLINICAL IMPRESSION: Patient is a  31 y.o. female who was seen today for physical therapy evaluation and treatment for lumbar radiculopathy.  Evaluation demonstrates decreased activity tolerance, decreased ROM, decreased strength, decreased balance, increased pain and  postural changes.  Paula Kane will benefit from skilled PT to address these issues and maximize her functional status.    OBJECTIVE IMPAIRMENTS decreased activity tolerance, decreased balance, difficulty walking, decreased ROM, decreased strength, and pain.   ACTIVITY LIMITATIONS carrying, lifting, bending, sitting, standing, squatting, bed mobility, dressing, and locomotion level  PARTICIPATION LIMITATIONS: cleaning, laundry, driving, shopping, community activity, and occupation  PERSONAL FACTORS Behavior pattern, Fitness, Past/current experiences, and Time since onset of injury/illness/exacerbation are also affecting patient's functional outcome.   REHAB POTENTIAL: Good  CLINICAL DECISION MAKING: Stable/uncomplicated  EVALUATION COMPLEXITY: Moderate   GOALS: Goals reviewed with patient? No  SHORT TERM GOALS: Target date: 06/16/2022  PT I in HEP to decrease her pain to no greater than a 6/10 to allow pt to be able to walk for 10 minutes at a time.  Baseline: Goal status: INITIAL  2.  Pt mm strength to be increased 1/2 grade to be able to come sit to stand 9x in 30 seconds  Baseline:  Goal status: INITIAL  3.  PT to be able to single leg stance for 15 seconds B to demonstrate improved core strength  Baseline:  Goal status: INITIAL    LONG TERM GOALS: Target date: 07/07/2022  Pt to be I in an advanced HEP to decrease her pain to no greater than a 3/10 to allow pt to be able to walk without an assistive device Baseline:  Goal status: INITIAL  2.  Pt to have no radicular sx Baseline:  Goal status: INITIAL  3.  PT ROM to be functional to be able to pick items off the floor without increased pain.  Baseline:  Goal status: INITIAL  4.  PT strength to be increased by one grade to be able to  come sit to stand x 12 reps in a 30 second time period.  Baseline:  Goal status: INITIAL  5.  PT to be able to stand/walk for 30 minutes at a time for housework and shopping  tasks.  Baseline:  Goal status: INITIAL  6.  PT to be able to single leg stance B for 30 seconds for improved safety walking on gravel/grass.  Baseline:  Goal status: INITIAL   PLAN: PT FREQUENCY: 2x/week  PT DURATION: 6 weeks  PLANNED INTERVENTIONS: Therapeutic exercises, Therapeutic activity, Balance training, Gait training, Patient/Family education, Self Care, and Manual therapy.  PLAN FOR NEXT SESSION: continue with lumbar stability program.    Rayetta Humphrey, PT CLT [phone]  05/26/2022, 5:18 PM

## 2022-05-29 ENCOUNTER — Ambulatory Visit (HOSPITAL_COMMUNITY): Payer: 59

## 2022-05-29 DIAGNOSIS — M5416 Radiculopathy, lumbar region: Secondary | ICD-10-CM | POA: Diagnosis not present

## 2022-05-29 DIAGNOSIS — M545 Low back pain, unspecified: Secondary | ICD-10-CM

## 2022-05-29 DIAGNOSIS — R262 Difficulty in walking, not elsewhere classified: Secondary | ICD-10-CM

## 2022-05-29 DIAGNOSIS — M6281 Muscle weakness (generalized): Secondary | ICD-10-CM | POA: Diagnosis not present

## 2022-05-29 NOTE — Therapy (Signed)
OUTPATIENT PHYSICAL THERAPY THORACOLUMBAR EVALUATION   Patient Name: Paula Kane MRN: [id number] DOB:[birth year], 31 y.o., female Today's Date: 05/29/2022   PT End of Session - 05/29/22 1656     Visit Number 2    Number of Visits 12    Date for PT Re-Evaluation 07/07/22    Authorization Type [employer]    Progress Note Due on Visit 10    PT Start Time 1650    PT Stop Time 1730    PT Time Calculation (min) 40 min    Activity Tolerance Patient limited by fatigue    Behavior During Therapy [facility] for tasks assessed/performed              Past Medical History:  Diagnosis Date   Anxiety    Arrhythmia    Asthma    Bipolar disorder (HCC)    Depression    Headache(784.0)    History of borderline personality disorder    Menorrhagia    Migraine headache with aura 11/17/2012   Associated with menses    Obsessive-compulsive disorder    Oppositional defiant disorder    Psychosis (HCC)    schizoaffective disorder   PTSD (post-traumatic stress disorder)    Past Surgical History:  Procedure Laterality Date   DORSAL COMPARTMENT RELEASE Right 12/18/2020   Procedure: RIGHT HAND DEQUERVAIN'S RELEASE;  Surgeon: Vickki Hearing, MD;  Location: [facility];  Service: Orthopedics;  Laterality: Right;   Patient Active Problem List   Diagnosis Date Noted   Back pain 04/16/2022   Suzette Battiest disease (radial styloid tenosynovitis)    Has a tremor 12/15/2012   Nausea alone 12/14/2012   Excessive menses 11/17/2012   Menses painful 11/17/2012   Migraine headache with aura 11/17/2012   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 Fuller Canada REFERRING DIAG:  M54.50 (ICD-10-CM) - Lumbar pain  Rationale for Evaluation and Treatment Rehabilitation  THERAPY DIAG:  Difficulty in walking, lumbar radiculopathy, mm weakness  ONSET DATE: chronic   SUBJECTIVE:                                                                                                                                                                                           SUBJECTIVE STATEMENT: 5/10 pain today.  Woke up with increased pain today. Reports she has been doing her exercises  at home. Bridge really hurts. PERTINENT HISTORY:  31 year old female presents with a 10-year history of back pain worse over the last year. She says now it is hard for her to walk and she cannot stand for long periods of time. The pain starts in her lower back and radiates down her left leg.  PAIN:  Are you having pain? Yes: :"NPRS scale: 2/10 worst is an 8-9,"Pain location: low back  "Pain description: just hurts","Aggravating factors: walking ,"Relieving factors: self medicate   PRECAUTIONS: None  WEIGHT BEARING RESTRICTIONS No  FALLS:  Has patient fallen in last 6 months? No  LIVING ENVIRONMENT: Lives with: lives with their family Lives in: House/apartment Stairs: Yes: External: 40 steps; on right going up Has following equipment at home: Single point cane and Wheelchair (manual)  OCCUPATION: Pt works from home   PLOF: Independent with household mobility with device  PATIENT GOALS less pain    OBJECTIVE:   DIAGNOSTIC FINDINGS:  X-rays are (-)  PATIENT SURVEYS:  FOTO 29    COGNITION:  Overall cognitive status: Within functional limits for tasks assessed     SENSATION: WFL  MUSCLE LENGTH: Hamstrings: Right 155 deg; Left 160 deg POSTURE: rounded shoulders, forward head, increased lumbar lordosis, and decreased thoracic kyphosis  LUMBAR ROM:   Active  A/PROM  eval  Flexion Fingers to mid calf, increased pain both going down and pulling up    Extension Normal  reps improve sx   Right lateral flexion   Left lateral flexion   Right rotation   Left rotation    (Blank rows = not tested)   LOWER EXTREMITY MMT:    MMT Right eval Left eval  Hip flexion 3+/5 4-/5  Hip extension 3-/5 3/5  Hip abduction 4/5 4/5  Hip adduction    Hip internal rotation    Hip external rotation    Knee flexion 3+/5 3+/5  Knee extension 4+/5 3-/5  Ankle dorsiflexion 3/5 3+/5  Ankle plantarflexion    Ankle inversion    Ankle eversion     (Blank rows = not tested)    FUNCTIONAL TESTS:  30 seconds chair stand test:  6 2 minute walk test: 175' with cane  Single leg stance:  RT:  15", Lt:  6"    TODAY'S TREATMENT  05/29/22 Review of HEP and goals  Sitting: Abdominal set 5" hold x 10 Abdominal set  with hip adduction with ball 5" x 10 Abdominal set with hip abduction with belt 5" hold x 10 Scapular retractions x 10 Shoulder rolls x 10  Supine:  LTR x 10 SKTC with towel 20" hold x 5 each Active hamstring stretch with towel 20" hold x 5 each     05/26/2022:  Evaluation; Sitting:  abdominal set x 10              Sit to stand x 5 Supine:  bridge x 10    PATIENT EDUCATION:  Education details: HEP Person educated: Patient Education method: Explanation Education comprehension: verbalized understanding and returned demonstration   HOME EXERCISE PROGRAM: 05/29/22  SK to chest, hamstring stretch, LTR [email or web address] Code: [email or web address] URL: [email or web address] Date: 05/26/2022 Prepared by: Rayetta Humphrey  Exercises - Seated Transversus Abdominis Bracing  - 10 x daily - 7 x weekly - 1 sets - 10 reps - 3-5 seconds  hold - Sit to Stand  - 3 x daily - 7 x weekly - 1 sets - 5 reps - Supine Bridge  - 2 x daily - 7  x weekly - 1 sets - 10 reps - 3-5 seconds hold  ASSESSMENT:  CLINICAL IMPRESSION: Today's session started with a review of HEP and goals. Patient verbalizes agreement with set rehab goals.progressed  abdominal bracing exercise today. Patient painful with attempting bridge today.  Added in some stretching and updated HEP. Patient needed to use a towel to assist with [facility] and hamstring stretching. Patient will benefit from continued skilled therapy services to address deficits and promote return to optimal function.       OBJECTIVE IMPAIRMENTS decreased activity tolerance, decreased balance, difficulty walking, decreased ROM, decreased strength, and pain.   ACTIVITY LIMITATIONS carrying, lifting, bending, sitting, standing, squatting, bed mobility, dressing, and locomotion level  PARTICIPATION LIMITATIONS: cleaning, laundry, driving, shopping, community activity, and occupation  PERSONAL FACTORS Behavior pattern, Fitness, Past/current experiences, and Time since onset of injury/illness/exacerbation are also affecting patient's functional outcome.   REHAB POTENTIAL: Good  CLINICAL DECISION MAKING: Stable/uncomplicated  EVALUATION COMPLEXITY: Moderate   GOALS: Goals reviewed with patient? No  SHORT TERM GOALS: Target date: 06/19/2022  PT I in HEP to decrease her pain to no greater than a 6/10 to allow pt to be able to walk for 10 minutes at a time.  Baseline: Goal status: IN PROGRESS  2.  Pt mm strength to be increased 1/2 grade to be able to come sit to stand 9x in 30 seconds  Baseline:  Goal status: IN PROGRESS  3.  PT to be able to single leg stance for 15 seconds B to demonstrate improved core strength  Baseline:  Goal status: IN PROGRESS    LONG TERM GOALS: Target date: 07/10/2022  Pt to be I in an advanced HEP to decrease her pain to no greater than a 3/10 to allow pt to be able to walk without an assistive device Baseline:  Goal status: IN PROGRESS  2.  Pt to have no radicular sx Baseline:  Goal status: IN PROGRESS  3.  PT ROM to be functional to be able to pick items off the floor without increased pain.  Baseline:  Goal status: IN PROGRESS  4.  PT strength  to be increased by one grade to be able to  come sit to stand x 12 reps in a 30 second time period.  Baseline:  Goal status: IN PROGRESS  5.  PT to be able to stand/walk for 30 minutes at a time for housework and shopping tasks.  Baseline:  Goal status: IN PROGRESS  6.  PT to be able to single leg stance B for 30 seconds for improved safety walking on gravel/grass.  Baseline:  Goal status: IN PROGRESS   PLAN: PT FREQUENCY: 2x/week  PT DURATION: 6 weeks  PLANNED INTERVENTIONS: Therapeutic exercises, Therapeutic activity, Balance training, Gait training, Patient/Family education, Self Care, and Manual therapy.  PLAN FOR NEXT SESSION: continue with lumbar stability program.    5:31 PM, 05/29/22 Vartan Kerins Small Xue Low MPT [facility] [place] [phone]

## 2022-06-03 ENCOUNTER — Ambulatory Visit (HOSPITAL_COMMUNITY): Payer: 59 | Attending: Orthopedic Surgery

## 2022-06-03 DIAGNOSIS — M545 Low back pain, unspecified: Secondary | ICD-10-CM | POA: Diagnosis not present

## 2022-06-03 DIAGNOSIS — R262 Difficulty in walking, not elsewhere classified: Secondary | ICD-10-CM | POA: Insufficient documentation

## 2022-06-03 DIAGNOSIS — M6281 Muscle weakness (generalized): Secondary | ICD-10-CM | POA: Insufficient documentation

## 2022-06-03 DIAGNOSIS — M5416 Radiculopathy, lumbar region: Secondary | ICD-10-CM | POA: Diagnosis not present

## 2022-06-03 NOTE — Therapy (Signed)
OUTPATIENT PHYSICAL THERAPY THORACOLUMBAR EVALUATION   Patient Name: Paula Kane MRN: [id number] DOB:[birth year], 31 y.o., female Today's Date: 06/03/2022   PT End of Session - 06/03/22 1605     Visit Number 3    Number of Visits 12    Date for PT Re-Evaluation 07/07/22    Authorization Type [employer]    Progress Note Due on Visit 10    PT Start Time 1604    PT Stop Time 1644    PT Time Calculation (min) 40 min    Activity Tolerance Patient limited by fatigue    Behavior During Therapy [facility] for tasks assessed/performed               Past Medical History:  Diagnosis Date   Anxiety    Arrhythmia    Asthma    Bipolar disorder ([place])    Depression    Headache(784.0)    History of borderline personality disorder    Menorrhagia    Migraine headache with aura 11/17/2012   Associated with menses    Obsessive-compulsive disorder    Oppositional defiant disorder    Psychosis ([place])    schizoaffective disorder   PTSD (post-traumatic stress disorder)    Past Surgical History:  Procedure Laterality Date   DORSAL COMPARTMENT RELEASE Right 12/18/2020   Procedure: RIGHT HAND DEQUERVAIN'S RELEASE;  Surgeon: Carole Civil, MD;  Location: [facility];  Service: Orthopedics;  Laterality: Right;   Patient Active Problem List   Diagnosis Date Noted   Back pain 04/16/2022   Harriet Pho disease (radial styloid tenosynovitis)    Has a tremor 12/15/2012   Nausea alone 12/14/2012   Excessive menses 11/17/2012   Menses painful 11/17/2012   Migraine headache with aura 11/17/2012   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 Arther Abbott REFERRING DIAG:  M54.50 (ICD-10-CM) - Lumbar pain  Rationale for Evaluation and Treatment Rehabilitation  THERAPY DIAG:  Difficulty in walking, lumbar radiculopathy, mm weakness  ONSET DATE: chronic   SUBJECTIVE:                                                                                                                                                                                           SUBJECTIVE STATEMENT: Decreased pain at rest; reports overall better than she was last week; 4-5/10 pain today PERTINENT  HISTORY:  31 year old female presents with a 10-year history of back pain worse over the last year. She says now it is hard for her to walk and she cannot stand for long periods of time. The pain starts in her lower back and radiates down her left leg.  PAIN:  Are you having pain? Yes: :"NPRS scale: 2/10 worst is an 8-9,"Pain location: low back  "Pain description: just hurts","Aggravating factors: walking ,"Relieving factors: self medicate   PRECAUTIONS: None  WEIGHT BEARING RESTRICTIONS No  FALLS:  Has patient fallen in last 6 months? No  LIVING ENVIRONMENT: Lives with: lives with their family Lives in: House/apartment Stairs: Yes: External: 40 steps; on right going up Has following equipment at home: Single point cane and Wheelchair (manual)  OCCUPATION: Pt works from home   PLOF: Independent with household mobility with device  PATIENT GOALS less pain    OBJECTIVE:   DIAGNOSTIC FINDINGS:  X-rays are (-)  PATIENT SURVEYS:  FOTO 29    COGNITION:  Overall cognitive status: Within functional limits for tasks assessed     SENSATION: WFL  MUSCLE LENGTH: Hamstrings: Right 155 deg; Left 160 deg POSTURE: rounded shoulders, forward head, increased lumbar lordosis, and decreased thoracic kyphosis  LUMBAR ROM:   Active  A/PROM  eval  Flexion Fingers to mid calf, increased pain both going down and pulling up   Extension Normal  reps improve sx    Right lateral flexion   Left lateral flexion   Right rotation   Left rotation    (Blank rows = not tested)   LOWER EXTREMITY MMT:    MMT Right eval Left eval  Hip flexion 3+/5 4-/5  Hip extension 3-/5 3/5  Hip abduction 4/5 4/5  Hip adduction    Hip internal rotation    Hip external rotation    Knee flexion 3+/5 3+/5  Knee extension 4+/5 3-/5  Ankle dorsiflexion 3/5 3+/5  Ankle plantarflexion    Ankle inversion    Ankle eversion     (Blank rows = not tested)    FUNCTIONAL TESTS:  30 seconds chair stand test:  6 2 minute walk test: 175' with cane  Single leg stance:  RT:  15", Lt:  6"    TODAY'S TREATMENT  06/03/22 Supine: LTR x 10 SKTC 20" x 5 each Active H/S 20" x 5 each Transverse abdominus bracing 5" x 10 Transverse abdominus bracing with hip adduction with ball 5" x 10 Transverse abdominus bracing with hip abduction with GTB 2 x 10  Sitting: Scapular retractions 2 x 10 Trial of cervical retractions causes increased pain left shoulderblade Cervical rotation x 10 each   Sit to stand x 10 no UE assist 30 sec sit to stand 11 times   05/29/22 Review of HEP and goals  Sitting: Abdominal set 5" hold x 10 Abdominal set  with hip adduction with ball 5" x 10 Abdominal set with hip abduction with belt 5" hold x 10 Scapular retractions x 10 Shoulder rolls x 10  Supine:  LTR x 10 SKTC with towel 20" hold x 5 each Active hamstring stretch with towel 20" hold x 5 each     05/26/2022:  Evaluation; Sitting:  abdominal set x 10              Sit to stand x 5 Supine:  bridge x 10    PATIENT EDUCATION:  Education details: HEP Person educated: Patient Education method: Explanation Education comprehension: verbalized understanding and returned demonstration  HOME EXERCISE PROGRAM 06/03/22  supine hip adduction with ball; supine hip abduction with GTB 05/29/22  SK to chest, hamstring stretch, LTR [email or web address] Code: [email or web address] URL:  [email or web address] Date: 05/26/2022 Prepared by: Rayetta Humphrey  Exercises - Seated Transversus Abdominis Bracing  - 10 x daily - 7 x weekly - 1 sets - 10 reps - 3-5 seconds  hold - Sit to Stand  - 3 x daily - 7 x weekly - 1 sets - 5 reps - Supine Bridge  - 2 x daily - 7 x weekly - 1 sets - 10 reps - 3-5 seconds hold  ASSESSMENT:  CLINICAL IMPRESSION: Today's session focused on lumbar mobility and postural and core strengthening.  Needs cueing for correct performance of transverse abdominus bracing. Updated HEP today. Reports some discomfort left shoulder blade with scapular retractions. Met short term goal for sit to stand in 30 sec today.    Patient will benefit from continued skilled therapy services to address deficits and promote return to optimal function.       OBJECTIVE IMPAIRMENTS decreased activity tolerance, decreased balance, difficulty walking, decreased ROM, decreased strength, and pain.   ACTIVITY LIMITATIONS carrying, lifting, bending, sitting, standing, squatting, bed mobility, dressing, and locomotion level  PARTICIPATION LIMITATIONS: cleaning, laundry, driving, shopping, community activity, and occupation  PERSONAL FACTORS Behavior pattern, Fitness, Past/current experiences, and Time since onset of injury/illness/exacerbation are also affecting patient's functional outcome.   REHAB POTENTIAL: Good  CLINICAL DECISION MAKING: Stable/uncomplicated  EVALUATION COMPLEXITY: Moderate   GOALS: Goals reviewed with patient? No  SHORT TERM GOALS: Target date: 06/19/2022  PT I in HEP to decrease her pain to no greater than a 6/10 to allow pt to be able to walk for 10 minutes at a time.  Baseline: Goal status: IN PROGRESS  2.  Pt mm strength to be increased 1/2 grade to be able to come sit to stand 9x in 30 seconds  Baseline: 06/03/22 11 times in 30 sit  to stand Goal status: met  3.  PT to be able to single leg stance for 15 seconds B to demonstrate  improved core strength  Baseline:  Goal status: IN PROGRESS    LONG TERM GOALS: Target date: 07/10/2022  Pt to be I in an advanced HEP to decrease her pain to no greater than a 3/10 to allow pt to be able to walk without an assistive device Baseline:  Goal status: IN PROGRESS  2.  Pt to have no radicular sx Baseline:  Goal status: IN PROGRESS  3.  PT ROM to be functional to be able to pick items off the floor without increased pain.  Baseline:  Goal status: IN PROGRESS  4.  PT strength to be increased by one grade to be able to  come sit to stand x 12 reps in a 30 second time period.  Baseline:  Goal status: IN PROGRESS  5.  PT to be able to stand/walk for 30 minutes at a time for housework and shopping tasks.  Baseline:  Goal status: IN PROGRESS  6.  PT to be able to single leg stance B for 30 seconds for improved safety walking on gravel/grass.  Baseline:  Goal status: IN PROGRESS   PLAN: PT FREQUENCY: 2x/week  PT DURATION: 6 weeks  PLANNED INTERVENTIONS: Therapeutic exercises, Therapeutic activity, Balance training, Gait training, Patient/Family education, Self Care, and Manual therapy.  PLAN FOR NEXT SESSION: continue with lumbar stability program.    4:45 PM, 06/03/22 Aayush Gelpi Small Trajon Rosete MPT [place]  [facility] [place] [phone]

## 2022-06-05 ENCOUNTER — Encounter (HOSPITAL_COMMUNITY): Payer: 59 | Admitting: Physical Therapy

## 2022-06-10 ENCOUNTER — Ambulatory Visit (HOSPITAL_COMMUNITY): Payer: 59

## 2022-06-10 DIAGNOSIS — M6281 Muscle weakness (generalized): Secondary | ICD-10-CM | POA: Diagnosis not present

## 2022-06-10 DIAGNOSIS — R262 Difficulty in walking, not elsewhere classified: Secondary | ICD-10-CM | POA: Diagnosis not present

## 2022-06-10 DIAGNOSIS — M545 Low back pain, unspecified: Secondary | ICD-10-CM | POA: Diagnosis not present

## 2022-06-10 DIAGNOSIS — M5416 Radiculopathy, lumbar region: Secondary | ICD-10-CM | POA: Diagnosis not present

## 2022-06-10 NOTE — Therapy (Signed)
OUTPATIENT PHYSICAL THERAPY THORACOLUMBAR EVALUATION   Patient Name: Paula Kane MRN: [id number] DOB:Aug 27, 1991, 31 y.o., female Today's Date: 06/10/2022   PT End of Session - 06/10/22 1604     Visit Number 4    Number of Visits 12    Date for PT Re-Evaluation 07/07/22    Authorization Type [employer]    Progress Note Due on Visit 10    PT Start Time 1602    PT Stop Time 1645    PT Time Calculation (min) 43 min    Activity Tolerance Patient limited by fatigue    Behavior During Therapy [facility] for tasks assessed/performed                Past Medical History:  Diagnosis Date   Anxiety    Arrhythmia    Asthma    Bipolar disorder ([place])    Depression    Headache(784.0)    History of borderline personality disorder    Menorrhagia    Migraine headache with aura 11/17/2012   Associated with menses    Obsessive-compulsive disorder    Oppositional defiant disorder    Psychosis ([place])    schizoaffective disorder   PTSD (post-traumatic stress disorder)    Past Surgical History:  Procedure Laterality Date   DORSAL COMPARTMENT RELEASE Right 12/18/2020   Procedure: RIGHT HAND DEQUERVAIN'S RELEASE;  Surgeon: Carole Civil, MD;  Location: [facility];  Service: Orthopedics;  Laterality: Right;   Patient Active Problem List   Diagnosis Date Noted   Back pain 04/16/2022   Harriet Pho disease (radial styloid tenosynovitis)    Has a tremor 12/15/2012   Nausea alone 12/14/2012   Excessive menses 11/17/2012   Menses painful 11/17/2012   Migraine headache with aura 11/17/2012   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 Arther Abbott REFERRING DIAG:  M54.50 (ICD-10-CM) - Lumbar pain  Rationale for Evaluation and Treatment Rehabilitation  THERAPY DIAG:  Difficulty in walking, lumbar radiculopathy, mm weakness  ONSET DATE: chronic   SUBJECTIVE:                                                                                                                                                                                           SUBJECTIVE STATEMENT: Overall increased pain since last Wednesday.  Had to take a narcotic for pain relief  and is now taking it before bed every night. 6-7/10 pain. Had to take Thursday and Friday off work due to pain.  Also had an unusually heavy period that was quite painful.   PERTINENT HISTORY:  31 year old female presents with a 10-year history of back pain worse over the last year. She says now it is hard for her to walk and she cannot stand for long periods of time. The pain starts in her lower back and radiates down her left leg.  PAIN:  Are you having pain? Yes: :"NPRS scale: 26-710 worst is an 8-9,"Pain location: low back  "Pain description: just hurts","Aggravating factors: walking ,"Relieving factors: self medicate   PRECAUTIONS: None  WEIGHT BEARING RESTRICTIONS No  FALLS:  Has patient fallen in last 6 months? No  LIVING ENVIRONMENT: Lives with: lives with their family Lives in: House/apartment Stairs: Yes: External: 40 steps; on right going up Has following equipment at home: Single point cane and Wheelchair (manual)  OCCUPATION: Pt works from home   PLOF: Independent with household mobility with device  PATIENT GOALS less pain    OBJECTIVE:   DIAGNOSTIC FINDINGS:  X-rays are (-)  PATIENT SURVEYS:  FOTO 29    COGNITION:  Overall cognitive status: Within functional limits for tasks assessed     SENSATION: WFL  MUSCLE LENGTH: Hamstrings: Right 155 deg; Left 160 deg POSTURE: rounded shoulders, forward head, increased lumbar lordosis, and  decreased thoracic kyphosis  LUMBAR ROM:   Active  A/PROM  eval  Flexion Fingers to mid calf, increased pain both going down and pulling up   Extension Normal  reps improve sx   Right lateral flexion   Left lateral flexion   Right rotation   Left rotation    (Blank rows = not tested)   LOWER EXTREMITY MMT:    MMT Right eval Left eval  Hip flexion 3+/5 4-/5  Hip extension 3-/5 3/5  Hip abduction 4/5 4/5  Hip adduction    Hip internal rotation    Hip external rotation    Knee flexion 3+/5 3+/5  Knee extension 4+/5 3-/5  Ankle dorsiflexion 3/5 3+/5  Ankle plantarflexion    Ankle inversion    Ankle eversion     (Blank rows = not tested)    FUNCTIONAL TESTS:  30 seconds chair stand test:  6 2 minute walk test: 175' with cane  Single leg stance:  RT:  15", Lt:  6"    TODAY'S TREATMENT  06/10/22 Seated with moist heat to low back x 5' to decrease pain  Sitting: Heel/toe raises on box 3 x 10 Long arc quads 2 x 10 each Marching 2 x 1' Cervical rotation, side bending and flexion and extension x 10 each Cervical retractions 2 x 10 Scapular retractions 2 x 10 X to V with bilateral upper extremities x 10  Standing Slant board 5 x 20"       06/03/22 Supine: LTR x 10 SKTC 20" x 5 each Active H/S 20" x 5 each Transverse abdominus bracing 5" x 10 Transverse abdominus bracing with hip adduction with ball 5" x 10 Transverse abdominus bracing with hip abduction with GTB 2 x 10  Sitting: Scapular retractions 2 x 10 Trial of cervical retractions causes increased pain left shoulderblade Cervical rotation x 10 each   Sit to stand x 10 no UE assist 30 sec sit to stand 11 times   05/29/22 Review of HEP and goals  Sitting: Abdominal set 5" hold x 10 Abdominal set  with hip adduction with ball 5" x 10 Abdominal set with hip abduction with belt 5" hold x 10 Scapular retractions x 10 Shoulder rolls x 10  Supine:  LTR x 10 SKTC with towel 20" hold x 5  each Active hamstring stretch with towel 20" hold x 5 each     05/26/2022:  Evaluation; Sitting:  abdominal set x 10              Sit to stand x 5 Supine:  bridge x 10    PATIENT EDUCATION:  Education details: HEP Person educated: Patient Education method: Explanation Education comprehension: verbalized understanding and returned demonstration   HOME EXERCISE PROGRAM 06/03/22  supine hip adduction with ball; supine hip abduction with GTB 05/29/22  SK to chest, hamstring stretch, LTR [email or web address] Code: [email or web address] URL: [email or web address] Date: 05/26/2022 Prepared by: Rayetta Humphrey  Exercises - Seated Transversus Abdominis Bracing  - 10 x daily - 7 x weekly - 1 sets - 10 reps - 3-5 seconds  hold - Sit to Stand  - 3 x daily - 7 x weekly - 1 sets - 5 reps - Supine Bridge  - 2 x daily - 7 x weekly - 1 sets - 10 reps - 3-5 seconds hold  ASSESSMENT:  CLINICAL IMPRESSION: Patient with reports of significant increased pain just after last session; having to take narcotics for pain relief and this is new.  Patient reports pain with supine positioning exercise so today just tried to get patient moving and decrease her pain levels.  Discussed her making an appointment with an ob-gyn as she has had a long and heavy period for the first time in a long time; possibly this is causing or at least contributing to her pain?  Patient will benefit from continued skilled therapy services to address deficits and promote return to optimal function.       OBJECTIVE IMPAIRMENTS decreased activity tolerance, decreased balance, difficulty walking, decreased ROM, decreased strength, and pain.   ACTIVITY LIMITATIONS carrying, lifting, bending, sitting, standing, squatting, bed mobility, dressing, and locomotion level  PARTICIPATION LIMITATIONS: cleaning, laundry, driving, shopping, community activity, and occupation  PERSONAL FACTORS Behavior pattern, Fitness, Past/current experiences,  and Time since onset of injury/illness/exacerbation are also affecting patient's functional outcome.   REHAB POTENTIAL: Good  CLINICAL DECISION MAKING: Stable/uncomplicated  EVALUATION COMPLEXITY: Moderate   GOALS: Goals reviewed with patient? No  SHORT TERM GOALS: Target date: 06/19/2022  PT I in HEP to decrease her pain to no greater than a 6/10 to allow pt to be able to walk for 10 minutes at a time.  Baseline: Goal status: IN PROGRESS  2.  Pt mm strength to be increased 1/2 grade to be able to come sit to stand 9x in 30 seconds  Baseline: 06/03/22 11 times in 30 sit  to stand Goal status: met  3.  PT to be able to single leg stance for 15 seconds B to demonstrate improved core strength  Baseline:  Goal status: IN PROGRESS    LONG TERM GOALS: Target date: 07/10/2022  Pt to be I in an advanced HEP to decrease her pain to no greater than a 3/10 to allow pt to be able to walk without an assistive device Baseline:  Goal status: IN PROGRESS  2.  Pt to have no radicular sx Baseline:  Goal status: IN PROGRESS  3.  PT ROM to be functional to be able to pick items off the floor without increased pain.  Baseline:  Goal status:  IN PROGRESS  4.  PT strength to be increased by one grade to be able to  come sit to stand x 12 reps in a 30 second time period.  Baseline:  Goal status: IN PROGRESS  5.  PT to be able to stand/walk for 30 minutes at a time for housework and shopping tasks.  Baseline:  Goal status: IN PROGRESS  6.  PT to be able to single leg stance B for 30 seconds for improved safety walking on gravel/grass.  Baseline:  Goal status: IN PROGRESS   PLAN: PT FREQUENCY: 2x/week  PT DURATION: 6 weeks  PLANNED INTERVENTIONS: Therapeutic exercises, Therapeutic activity, Balance training, Gait training, Patient/Family education, Self Care, and Manual therapy.  PLAN FOR NEXT SESSION: continue with lumbar stability program.  Resume supine exercises as able.   4:46  PM, 06/10/22 Daylani Deblois Small Israel Werts MPT [facility] [place] [phone]

## 2022-06-11 ENCOUNTER — Encounter (HOSPITAL_COMMUNITY): Payer: 59

## 2022-06-17 ENCOUNTER — Encounter (HOSPITAL_COMMUNITY): Payer: 59 | Admitting: Physical Therapy

## 2022-06-19 ENCOUNTER — Encounter (HOSPITAL_COMMUNITY): Payer: Self-pay

## 2022-06-19 ENCOUNTER — Encounter (HOSPITAL_COMMUNITY): Payer: 59

## 2022-06-19 ENCOUNTER — Ambulatory Visit (HOSPITAL_COMMUNITY): Payer: 59

## 2022-06-19 DIAGNOSIS — M545 Low back pain, unspecified: Secondary | ICD-10-CM | POA: Diagnosis not present

## 2022-06-19 DIAGNOSIS — M6281 Muscle weakness (generalized): Secondary | ICD-10-CM | POA: Diagnosis not present

## 2022-06-19 DIAGNOSIS — M5416 Radiculopathy, lumbar region: Secondary | ICD-10-CM | POA: Diagnosis not present

## 2022-06-19 DIAGNOSIS — R262 Difficulty in walking, not elsewhere classified: Secondary | ICD-10-CM

## 2022-06-19 NOTE — Therapy (Signed)
OUTPATIENT PHYSICAL THERAPY THORACOLUMBAR TREATMENT   Patient Name: Paula Kane MRN: [id number] DOB:August 31, 1991, 31 y.o., female Today's Date: 06/19/2022   PT End of Session - 06/19/22 1614     Visit Number 5    Number of Visits 12    Date for PT Re-Evaluation 07/07/22    Authorization Type [employer]    Progress Note Due on Visit 10    PT Start Time 1552    PT Stop Time 1635    PT Time Calculation (min) 43 min    Activity Tolerance Patient tolerated treatment well;Patient limited by pain;No increased pain    Behavior During Therapy [facility] for tasks assessed/performed                 Past Medical History:  Diagnosis Date   Anxiety    Arrhythmia    Asthma    Bipolar disorder ([place])    Depression    Headache(784.0)    History of borderline personality disorder    Menorrhagia    Migraine headache with aura 11/17/2012   Associated with menses    Obsessive-compulsive disorder    Oppositional defiant disorder    Psychosis ([place])    schizoaffective disorder   PTSD (post-traumatic stress disorder)    Past Surgical History:  Procedure Laterality Date   DORSAL COMPARTMENT RELEASE Right 12/18/2020   Procedure: RIGHT HAND DEQUERVAIN'S RELEASE;  Surgeon: Carole Civil, MD;  Location: [facility];  Service: Orthopedics;  Laterality: Right;   Patient Active Problem List   Diagnosis Date Noted   Back pain 04/16/2022   Harriet Pho disease (radial styloid tenosynovitis)    Has a tremor 12/15/2012   Nausea alone 12/14/2012   Excessive menses 11/17/2012   Menses painful 11/17/2012   Migraine headache with aura 11/17/2012   Insomnia due to mental disorder 11/17/2012   Bipolar I disorder, most recent episode (or current) depressed, severe, without mention of psychotic behavior 11/16/2012   PTSD (post-traumatic stress disorder) 11/16/2012   Panic disorder 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 Arther Abbott REFERRING DIAG:  M54.50 (ICD-10-CM) - Lumbar pain  Rationale for Evaluation and Treatment Rehabilitation  THERAPY DIAG:  Difficulty in walking, lumbar radiculopathy, mm weakness  ONSET DATE: chronic   SUBJECTIVE:                                                                                                                                                                                           SUBJECTIVE STATEMENT: Reports pain scale 5/10 Lt side lower back, reports  pain does go up to shoulder blades.  Reports she has been constant soreness and notices increased pain when posture.  Increased pain with standing or walking for further than 2-3 minutes.  Reports heavy period has resolved, has not found OBGYN yet. Pt occapioned    PERTINENT HISTORY:  31 year old female presents with a 10-year history of back pain worse over the last year. She says now it is hard for her to walk and she cannot stand for long periods of time. The pain starts in her lower back and radiates down her left leg.  PAIN:  Are you having pain? Yes: :"NPRS scale: 5/10 worst is an 8-9," Pain location: low back  " Pain description: just hurts"," Aggravating factors: walking ,"Relieving factors: self medicate   PRECAUTIONS: None  WEIGHT BEARING RESTRICTIONS No  FALLS:  Has patient fallen in last 6 months? No  LIVING ENVIRONMENT: Lives with: lives with their family Lives in: House/apartment Stairs: Yes: External: 40 steps; on right going up Has following equipment at home: Single point cane and Wheelchair (manual)  OCCUPATION: Pt works from home   PLOF: Independent with household mobility with device  PATIENT GOALS less pain    OBJECTIVE:   DIAGNOSTIC FINDINGS:  X-rays are (-)  PATIENT SURVEYS:  FOTO 29    COGNITION:  Overall cognitive status: Within functional limits for tasks assessed     SENSATION: WFL  MUSCLE LENGTH: Hamstrings: Right  155 deg; Left 160 deg POSTURE: rounded shoulders, forward head, increased lumbar lordosis, and decreased thoracic kyphosis  LUMBAR ROM:   Active  A/PROM  eval  Flexion Fingers to mid calf, increased pain both going down and pulling up   Extension Normal  reps improve sx   Right lateral flexion   Left lateral flexion   Right rotation   Left rotation    (Blank rows = not tested)   LOWER EXTREMITY MMT:    MMT Right eval Left eval  Hip flexion 3+/5 4-/5  Hip extension 3-/5 3/5  Hip abduction 4/5 4/5  Hip adduction    Hip internal rotation    Hip external rotation    Knee flexion 3+/5 3+/5  Knee extension 4+/5 3-/5  Ankle dorsiflexion 3/5 3+/5  Ankle plantarflexion    Ankle inversion    Ankle eversion     (Blank rows = not tested)    FUNCTIONAL TESTS:  30 seconds chair stand test:  6 2 minute walk test: 8' with cane  Single leg stance:  RT:  15", Lt:  6"    TODAY'S TREATMENT  06/19/22: Quadruped:  Cat/camel 8 reps x10" holds Child's pose 2x 30" 1x neutral; 1x to the Rt for lats stretch Prone hip extension 2 setsx 5 reps  Supine:  LTR 5 10" Bridge DC due to reports of increased radicular symptoms from back to anterior groin Dead bug with ab set  Sidelying: Abd 15x  Standing: 3D Hip excursion (weight shifting with arm stretch, rotation, STS, lumbar extension pain free range.)  06/10/22 Seated with moist heat to low back x 5' to decrease pain  Sitting: Heel/toe raises on box 3 x 10 Long arc quads 2 x 10 each Marching 2 x 1' Cervical rotation, side bending and flexion and extension x 10 each Cervical retractions 2 x 10 Scapular retractions 2 x 10 X to V with bilateral upper extremities x 10  Standing Slant board 5 x 20"       06/03/22 Supine: LTR x 10 SKTC 20" x 5  each Active H/S 20" x 5 each Transverse abdominus bracing 5" x 10 Transverse abdominus bracing with hip adduction with ball 5" x 10 Transverse abdominus bracing with hip  abduction with GTB 2 x 10  Sitting: Scapular retractions 2 x 10 Trial of cervical retractions causes increased pain left shoulderblade Cervical rotation x 10 each   Sit to stand x 10 no UE assist 30 sec sit to stand 11 times   05/29/22 Review of HEP and goals  Sitting: Abdominal set 5" hold x 10 Abdominal set  with hip adduction with ball 5" x 10 Abdominal set with hip abduction with belt 5" hold x 10 Scapular retractions x 10 Shoulder rolls x 10  Supine:  LTR x 10 SKTC with towel 20" hold x 5 each Active hamstring stretch with towel 20" hold x 5 each     05/26/2022:  Evaluation; Sitting:  abdominal set x 10              Sit to stand x 5 Supine:  bridge x 10    PATIENT EDUCATION:  Education details: HEP Person educated: Patient Education method: Explanation Education comprehension: verbalized understanding and returned demonstration   HOME EXERCISE PROGRAM 06/03/22  supine hip adduction with ball; supine hip abduction with GTB 05/29/22  SK to chest, hamstring stretch, LTR [email or web address] Code: [email or web address] URL: [email or web address] Date: 05/26/2022 Prepared by: Virgina Organ  Exercises - Seated Transversus Abdominis Bracing  - 10 x daily - 7 x weekly - 1 sets - 10 reps - 3-5 seconds  hold - Sit to Stand  - 3 x daily - 7 x weekly - 1 sets - 5 reps - Supine Bridge  - 2 x daily - 7 x weekly - 1 sets - 10 reps - 3-5 seconds hold  ASSESSMENT:  CLINICAL IMPRESSION: Session focus on proximal core/hip strengthening and lumbar mobility for pain control.  Began quadruped exercises for lumbar mobility with reports of relief and prone hip extension for strengthening with positive results.  Pt main difficulty is completing quadruped position on softer hospital bed, reports elbows getting tired.  Resumed supine exercises with reports of increased radicular symptoms down to Lt knee during bridge exercise which was D/C due to increased pain and completed as glut sets  with reports of symptoms reduced.  Progressed core stability with some cueing required for TrA activation.  Given lats stretches and lumbar mobility exercises as additional HEP with printout and folder given.  No reoprts of increased pain through session.      OBJECTIVE IMPAIRMENTS decreased activity tolerance, decreased balance, difficulty walking, decreased ROM, decreased strength, and pain.   ACTIVITY LIMITATIONS carrying, lifting, bending, sitting, standing, squatting, bed mobility, dressing, and locomotion level  PARTICIPATION LIMITATIONS: cleaning, laundry, driving, shopping, community activity, and occupation  PERSONAL FACTORS Behavior pattern, Fitness, Past/current experiences, and Time since onset of injury/illness/exacerbation are also affecting patient's functional outcome.   REHAB POTENTIAL: Good  CLINICAL DECISION MAKING: Stable/uncomplicated  EVALUATION COMPLEXITY: Moderate   GOALS: Goals reviewed with patient? No  SHORT TERM GOALS: Target date: 06/19/2022  PT I in HEP to decrease her pain to no greater than a 6/10 to allow pt to be able to walk for 10 minutes at a time.  Baseline: Goal status: IN PROGRESS  2.  Pt mm strength to be increased 1/2 grade to be able to come sit to stand 9x in 30 seconds  Baseline: 06/03/22 11 times in 30 sit  to stand Goal status: met  3.  PT to be able to single leg stance for 15 seconds B to demonstrate improved core strength  Baseline:  Goal status: IN PROGRESS    LONG TERM GOALS: Target date: 07/10/2022  Pt to be I in an advanced HEP to decrease her pain to no greater than a 3/10 to allow pt to be able to walk without an assistive device Baseline:  Goal status: IN PROGRESS  2.  Pt to have no radicular sx Baseline:  Goal status: IN PROGRESS  3.  PT ROM to be functional to be able to pick items off the floor without increased pain.  Baseline:  Goal status: IN PROGRESS  4.  PT strength to be increased by one grade to be  able to  come sit to stand x 12 reps in a 30 second time period.  Baseline:  Goal status: IN PROGRESS  5.  PT to be able to stand/walk for 30 minutes at a time for housework and shopping tasks.  Baseline:  Goal status: IN PROGRESS  6.  PT to be able to single leg stance B for 30 seconds for improved safety walking on gravel/grass.  Baseline:  Goal status: IN PROGRESS   PLAN: PT FREQUENCY: 2x/week  PT DURATION: 6 weeks  PLANNED INTERVENTIONS: Therapeutic exercises, Therapeutic activity, Balance training, Gait training, Patient/Family education, Self Care, and Manual therapy.  PLAN FOR NEXT SESSION: continue with lumbar stability program.  Resume supine exercises as able.   Ihor Austin, LPTA/CLT; CBIS [phone]  4:42 PM, 06/19/22

## 2022-06-24 ENCOUNTER — Encounter (HOSPITAL_COMMUNITY): Payer: 59

## 2022-06-26 ENCOUNTER — Ambulatory Visit (HOSPITAL_COMMUNITY): Payer: 59 | Admitting: Physical Therapy

## 2022-06-26 DIAGNOSIS — M545 Low back pain, unspecified: Secondary | ICD-10-CM

## 2022-06-26 DIAGNOSIS — R262 Difficulty in walking, not elsewhere classified: Secondary | ICD-10-CM | POA: Diagnosis not present

## 2022-06-26 DIAGNOSIS — M6281 Muscle weakness (generalized): Secondary | ICD-10-CM | POA: Diagnosis not present

## 2022-06-26 DIAGNOSIS — M5416 Radiculopathy, lumbar region: Secondary | ICD-10-CM | POA: Diagnosis not present

## 2022-06-26 NOTE — Therapy (Signed)
OUTPATIENT PHYSICAL THERAPY THORACOLUMBAR TREATMENT   Patient Name: Paula Kane MRN: [id number] DOB:07/29/1991, 31 y.o., female Today's Date: 06/26/2022   PT End of Session - 06/26/22 1647     Visit Number 6    Number of Visits 12    Date for PT Re-Evaluation 07/07/22    Authorization Type [employer]    Progress Note Due on Visit 10    PT Start Time 1613    PT Stop Time 1648    PT Time Calculation (min) 35 min    Activity Tolerance Patient tolerated treatment well;Patient limited by pain;No increased pain    Behavior During Therapy [facility] for tasks assessed/performed                  Past Medical History:  Diagnosis Date   Anxiety    Arrhythmia    Asthma    Bipolar disorder ([place])    Depression    Headache(784.0)    History of borderline personality disorder    Menorrhagia    Migraine headache with aura 11/17/2012   Associated with menses    Obsessive-compulsive disorder    Oppositional defiant disorder    Psychosis ([place])    schizoaffective disorder   PTSD (post-traumatic stress disorder)    Past Surgical History:  Procedure Laterality Date   DORSAL COMPARTMENT RELEASE Right 12/18/2020   Procedure: RIGHT HAND DEQUERVAIN'S RELEASE;  Surgeon: Carole Civil, MD;  Location: [facility];  Service: Orthopedics;  Laterality: Right;   Patient Active Problem List   Diagnosis Date Noted   Back pain 04/16/2022   Harriet Pho disease (radial styloid tenosynovitis)    Has a tremor 12/15/2012   Nausea alone 12/14/2012   Excessive menses 11/17/2012   Menses painful 11/17/2012   Migraine headache with aura 11/17/2012   Insomnia due to mental disorder 11/17/2012   Bipolar I disorder, most recent episode (or current) depressed, severe, without mention of psychotic behavior 11/16/2012   PTSD (post-traumatic stress disorder) 11/16/2012   Panic disorder 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 Arther Abbott REFERRING DIAG:  M54.50 (ICD-10-CM) - Lumbar pain  Rationale for Evaluation and Treatment Rehabilitation  THERAPY DIAG:  Difficulty in walking, lumbar radiculopathy, mm weakness  ONSET DATE: chronic   SUBJECTIVE:                                                                                                                                                                                           SUBJECTIVE STATEMENT: PT states that she feels like she is  going backward because she is in so much pain .  She felt like she had to go back to the hospital because she could not move without excruciating pain.  She has been taking oxy and feels better.  She states she is waiting for an MRI but her insurance requires her to have PT first.      PERTINENT HISTORY:  31 year old female presents with a 10-year history of back pain worse over the last year. She says now it is hard for her to walk and she cannot stand for long periods of time. The pain starts in her lower back and radiates down her left leg.  PAIN:  Are you having pain? Yes: :"NPRS scale: 6/10 worst is an 8-9," Pain location: low back  " Pain description: just hurts"," Aggravating factors: walking ,"Relieving factors: self medicate   PRECAUTIONS: None  WEIGHT BEARING RESTRICTIONS No  FALLS:  Has patient fallen in last 6 months? No  LIVING ENVIRONMENT: Lives with: lives with their family Lives in: House/apartment Stairs: Yes: External: 40 steps; on right going up Has following equipment at home: Single point cane and Wheelchair (manual)  OCCUPATION: Pt works from home   PLOF: Independent with household mobility with device  PATIENT GOALS less pain    OBJECTIVE:   DIAGNOSTIC FINDINGS:  X-rays are (-)  PATIENT SURVEYS:  FOTO 29  MUSCLE LENGTH: Hamstrings: Right 155 deg; Left 160 deg POSTURE: rounded shoulders, forward head, increased lumbar lordosis, and decreased  thoracic kyphosis  LUMBAR ROM:   Active  A/PROM  eval  Flexion Fingers to mid calf, increased pain both going down and pulling up   Extension Normal  reps improve sx   Right lateral flexion   Left lateral flexion   Right rotation   Left rotation    (Blank rows = not tested)   LOWER EXTREMITY MMT:    MMT Right eval Left eval  Hip flexion 3+/5 4-/5  Hip extension 3-/5 3/5  Hip abduction 4/5 4/5  Hip adduction    Hip internal rotation    Hip external rotation    Knee flexion 3+/5 3+/5  Knee extension 4+/5 3-/5  Ankle dorsiflexion 3/5 3+/5  Ankle plantarflexion    Ankle inversion    Ankle eversion     (Blank rows = not tested)    FUNCTIONAL TESTS:  30 seconds chair stand test:  6 2 minute walk test: 32' with cane  Single leg stance:  RT:  15", Lt:  6"    TODAY'S TREATMENT  06/26/22 Standing: Hip excursion x 3  Rows x 10 green theraband  Scapular retraction x 10 green theraband  Sitting :  sit to stand Supine:  dead bug x 10                Knee fall out x 10 B                LTR x 10                SLR x 10 B  Sidelying :  hip abduction x 10 B Prone :       Press up x 5                    Combination glut/heel squeeze x 10  06/19/22: Quadruped:  Cat/camel 8 reps x10" holds Child's pose 2x 30" 1x neutral; 1x to the Rt for lats stretch Prone hip extension 2 setsx 5 reps  Supine:  LTR 5 10" Bridge DC due to reports of increased radicular symptoms from back to anterior groin Dead bug with ab set  Sidelying: Abd 15x  Standing: 3D Hip excursion (weight shifting with arm stretch, rotation, STS, lumbar extension pain free range.)   Supine:  LTR x 10 SKTC with towel 20" hold x 5 each Active hamstring stretch with towel 20" hold x 5 each     05/26/2022:  Evaluation; Sitting:  abdominal set x 10              Sit to stand x 5 Supine:  bridge x 10    PATIENT EDUCATION:  Education details: HEP Person educated: Patient Education method:  Explanation Education comprehension: verbalized understanding and returned demonstration   HOME EXERCISE PROGRAM 06/26/22 Access Code: [email or web address] URL: [email or web address] Date: 06/26/2022 Prepared by: Rayetta Humphrey  Exercises - Dead Bug  - 1 x daily - 7 x weekly - 1 sets - 10 reps - 3" hold - Bent Knee Fallouts  - 1 x daily - 7 x weekly - 1 sets - 10 reps - 3-5" hold - Standing Shoulder Extension with Resistance  - 1 x daily - 7 x weekly - 1 sets - 10 reps - 3-5" hold - Standing Shoulder Row with Anchored Resistance  - 1 x daily - 7 x weekly - 1 sets - 10 reps - 3-5" hold 06/03/22  supine hip adduction with ball; supine hip abduction with GTB 05/29/22  SK to chest, hamstring stretch, LTR [email or web address] Code: [email or web address] URL: [email or web address] Date: 05/26/2022 Prepared by: Rayetta Humphrey  Exercises - Seated Transversus Abdominis Bracing  - 10 x daily - 7 x weekly - 1 sets - 10 reps - 3-5 seconds  hold - Sit to Stand  - 3 x daily - 7 x weekly - 1 sets - 5 reps - Supine Bridge  - 2 x daily - 7 x weekly - 1 sets - 10 reps - 3-5 seconds hold  ASSESSMENT:  CLINICAL IMPRESSION:  PT frustrated with progress; therapist explained that pt core is very weak and it is going to take months to strengthen.  PT comes to department in a wheelchair, therapist encouraged pt to walk and not use wheelchair.  Therapist updated HEP to improve core strength.  PT will continue to benefit from skilled PT to improve strength and functional tolerance as well as decrease pt pain.     OBJECTIVE IMPAIRMENTS decreased activity tolerance, decreased balance, difficulty walking, decreased ROM, decreased strength, and pain.   ACTIVITY LIMITATIONS carrying, lifting, bending, sitting, standing, squatting, bed mobility, dressing, and locomotion level  PARTICIPATION LIMITATIONS: cleaning, laundry, driving, shopping, community activity, and occupation  PERSONAL FACTORS Behavior pattern,  Fitness, Past/current experiences, and Time since onset of injury/illness/exacerbation are also affecting patient's functional outcome.   REHAB POTENTIAL: Good  CLINICAL DECISION MAKING: Stable/uncomplicated  EVALUATION COMPLEXITY: Moderate   GOALS: Goals reviewed with patient? No  SHORT TERM GOALS: Target date: 06/19/2022  PT I in HEP to decrease her pain to no greater than a 6/10 to allow pt to be able to walk for 10 minutes at a time.  Baseline: Goal status: IN PROGRESS  2.  Pt mm strength to be increased 1/2 grade to be able to come sit to stand 9x in 30 seconds  Baseline: 06/03/22 11 times in 30 sit  to stand Goal status: met  3.  PT to be able to single leg stance for 15 seconds B to demonstrate improved core strength  Baseline:  Goal status: IN PROGRESS    LONG TERM GOALS: Target date: 07/10/2022  Pt to be I in an advanced HEP to decrease her pain to no greater than a 3/10 to allow pt to be able to walk without an assistive device Baseline:  Goal status: IN PROGRESS  2.  Pt to have no radicular sx Baseline:  Goal status: IN PROGRESS  3.  PT ROM to be functional to be able to pick items off the floor without increased pain.  Baseline:  Goal status: IN PROGRESS  4.  PT strength to be increased by one grade to be able to  come sit to stand x 12 reps in a 30 second time period.  Baseline:  Goal status: IN PROGRESS  5.  PT to be able to stand/walk for 30 minutes at a time for housework and shopping tasks.  Baseline:  Goal status: IN PROGRESS  6.  PT to be able to single leg stance B for 30 seconds for improved safety walking on gravel/grass.  Baseline:  Goal status: IN PROGRESS   PLAN: PT FREQUENCY: 2x/week  PT DURATION: 6 weeks  PLANNED INTERVENTIONS: Therapeutic exercises, Therapeutic activity, Balance training, Gait training, Patient/Family education, Self Care, and Manual therapy.  PLAN FOR NEXT SESSION: continue with lumbar stability program.  Resume  supine exercises as able.   Rayetta Humphrey, PT CLT [phone]   5:03 PM, 06/26/22

## 2022-06-30 ENCOUNTER — Ambulatory Visit: Payer: 59 | Admitting: Orthopedic Surgery

## 2022-07-01 ENCOUNTER — Encounter (HOSPITAL_COMMUNITY): Payer: 59

## 2022-07-02 DIAGNOSIS — R69 Illness, unspecified: Secondary | ICD-10-CM | POA: Diagnosis not present

## 2022-07-02 DIAGNOSIS — F429 Obsessive-compulsive disorder, unspecified: Secondary | ICD-10-CM | POA: Diagnosis not present

## 2022-07-02 DIAGNOSIS — F4310 Post-traumatic stress disorder, unspecified: Secondary | ICD-10-CM | POA: Diagnosis not present

## 2022-07-03 ENCOUNTER — Encounter (HOSPITAL_COMMUNITY): Payer: Self-pay

## 2022-07-03 ENCOUNTER — Ambulatory Visit (HOSPITAL_COMMUNITY): Payer: 59 | Attending: Orthopedic Surgery

## 2022-07-03 DIAGNOSIS — M545 Low back pain, unspecified: Secondary | ICD-10-CM | POA: Diagnosis present

## 2022-07-03 DIAGNOSIS — R262 Difficulty in walking, not elsewhere classified: Secondary | ICD-10-CM | POA: Insufficient documentation

## 2022-07-03 DIAGNOSIS — M6281 Muscle weakness (generalized): Secondary | ICD-10-CM | POA: Insufficient documentation

## 2022-07-03 DIAGNOSIS — M5416 Radiculopathy, lumbar region: Secondary | ICD-10-CM | POA: Insufficient documentation

## 2022-07-03 NOTE — Therapy (Signed)
OUTPATIENT PHYSICAL THERAPY THORACOLUMBAR TREATMENT   Patient Name: Paula Kane MRN: [id number] DOB:01/16/1991, 31 y.o., female Today's Date: 07/03/2022   PT End of Session - 07/03/22 1653     Visit Number 7    Number of Visits 12    Date for PT Re-Evaluation 07/07/22    Authorization Type [employer]    Progress Note Due on Visit 10    PT Start Time 1603    PT Stop Time 1645    PT Time Calculation (min) 42 min    Activity Tolerance Patient tolerated treatment well;Patient limited by pain;No increased pain    Behavior During Therapy [facility] for tasks assessed/performed                   Past Medical History:  Diagnosis Date   Anxiety    Arrhythmia    Asthma    Bipolar disorder ([place])    Depression    Headache(784.0)    History of borderline personality disorder    Menorrhagia    Migraine headache with aura 11/17/2012   Associated with menses    Obsessive-compulsive disorder    Oppositional defiant disorder    Psychosis ([place])    schizoaffective disorder   PTSD (post-traumatic stress disorder)    Past Surgical History:  Procedure Laterality Date   DORSAL COMPARTMENT RELEASE Right 12/18/2020   Procedure: RIGHT HAND DEQUERVAIN'S RELEASE;  Surgeon: Carole Civil, MD;  Location: [facility];  Service: Orthopedics;  Laterality: Right;   Patient Active Problem List   Diagnosis Date Noted   Back pain 04/16/2022   Harriet Pho disease (radial styloid tenosynovitis)    Has a tremor 12/15/2012   Nausea alone 12/14/2012   Excessive menses 11/17/2012   Menses painful 11/17/2012   Migraine headache with aura 11/17/2012   Insomnia due to mental disorder 11/17/2012   Bipolar I disorder, most recent episode (or current) depressed, severe, without mention of psychotic behavior 11/16/2012   PTSD (post-traumatic stress disorder) 11/16/2012   Panic disorder 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 Arther Abbott Next apt 07/17/22 REFERRING DIAG:  M54.50 (ICD-10-CM) - Lumbar pain  Rationale for Evaluation and Treatment Rehabilitation  THERAPY DIAG:  Difficulty in walking, lumbar radiculopathy, mm weakness  ONSET DATE: chronic   SUBJECTIVE:                                                                                                                                                                                           SUBJECTIVE STATEMENT: Pt reports she is  feeling good today, continues to have pain scale 5-6/10 lower back.  Reports increased fatigue that caused her to fall in bathroom from her legs shaking.  Reports radicular symptoms lateral aspect of Lt thigh ending at knee.  Pt arrived in [facility] though walked with [facility] to room.  Accompanied by fiance this session.  PERTINENT HISTORY:  31 year old female presents with a 10-year history of back pain worse over the last year. She says now it is hard for her to walk and she cannot stand for long periods of time. The pain starts in her lower back and radiates down her left leg.  PAIN:  Are you having pain? Yes: :"NPRS scale: 6/10 worst is an 8-9," Pain location: low back  " Pain description: sore and throbbing  Aggravating factors: walking ,"Relieving factors: self medicate   PRECAUTIONS: None  WEIGHT BEARING RESTRICTIONS No  FALLS:  Has patient fallen in last 6 months? No  LIVING ENVIRONMENT: Lives with: lives with their family Lives in: House/apartment Stairs: Yes: External: 40 steps; on right going up Has following equipment at home: Single point cane and Wheelchair (manual)  OCCUPATION: Pt works from home   PLOF: Independent with household mobility with device  PATIENT GOALS less pain    OBJECTIVE:   DIAGNOSTIC FINDINGS:  X-rays are (-)  PATIENT SURVEYS:  FOTO 29  MUSCLE LENGTH: Hamstrings: Right 155 deg; Left 160 deg POSTURE: rounded shoulders, forward head, increased  lumbar lordosis, and decreased thoracic kyphosis  LUMBAR ROM:   Active  A/PROM  eval  Flexion Fingers to mid calf, increased pain both going down and pulling up   Extension Normal  reps improve sx   Right lateral flexion   Left lateral flexion   Right rotation   Left rotation    (Blank rows = not tested)   LOWER EXTREMITY MMT:    MMT Right eval Left eval  Hip flexion 3+/5 4-/5  Hip extension 3-/5 3/5  Hip abduction 4/5 4/5  Hip adduction    Hip internal rotation    Hip external rotation    Knee flexion 3+/5 3+/5  Knee extension 4+/5 3-/5  Ankle dorsiflexion 3/5 3+/5  Ankle plantarflexion    Ankle inversion    Ankle eversion     (Blank rows = not tested)    FUNCTIONAL TESTS:  30 seconds chair stand test:  6 2 minute walk test: 66' with cane  Single leg stance:  RT:  15", Lt:  6"    TODAY'S TREATMENT  07/03/22 Standing: 3D Hip excursion x 10  Shoulder extension 15x  Paloff 15x each side Supine: dead bug with ab set  Clam with ab set and GTB for stability Sitting: Paloff with no LE support Prone: hip extension 10x  Press up 10x 10" 06/26/22 Standing: Hip excursion x 3  Rows x 10 green theraband  Scapular retraction x 10 green theraband  Sitting :  sit to stand Supine:  dead bug x 10                Knee fall out x 10 B                LTR x 10                SLR x 10 B  Sidelying :  hip abduction x 10 B Prone :       Press up x 5  Combination glut/heel squeeze x 10  06/19/22: Quadruped:  Cat/camel 8 reps x10" holds Child's pose 2x 30" 1x neutral; 1x to the Rt for lats stretch Prone hip extension 2 setsx 5 reps  Supine:  LTR 5 10" Bridge DC due to reports of increased radicular symptoms from back to anterior groin Dead bug with ab set  Sidelying: Abd 15x  Standing: 3D Hip excursion (weight shifting with arm stretch, rotation, STS, lumbar extension pain free range.)   Supine:  LTR x 10 SKTC with towel 20" hold x 5  each Active hamstring stretch with towel 20" hold x 5 each     05/26/2022:  Evaluation; Sitting:  abdominal set x 10              Sit to stand x 5 Supine:  bridge x 10    PATIENT EDUCATION:  Education details: HEP Person educated: Patient Education method: Explanation Education comprehension: verbalized understanding and returned demonstration   HOME EXERCISE PROGRAM 06/26/22 Access Code: [email or web address] URL: [email or web address] Date: 06/26/2022 Prepared by: Rayetta Humphrey  Exercises - Dead Bug  - 1 x daily - 7 x weekly - 1 sets - 10 reps - 3" hold - Bent Knee Fallouts  - 1 x daily - 7 x weekly - 1 sets - 10 reps - 3-5" hold - Standing Shoulder Extension with Resistance  - 1 x daily - 7 x weekly - 1 sets - 10 reps - 3-5" hold - Standing Shoulder Row with Anchored Resistance  - 1 x daily - 7 x weekly - 1 sets - 10 reps - 3-5" hold 06/03/22  supine hip adduction with ball; supine hip abduction with GTB 05/29/22  SK to chest, hamstring stretch, LTR [email or web address] Code: [email or web address] URL: [email or web address] Date: 05/26/2022 Prepared by: Rayetta Humphrey  Exercises - Seated Transversus Abdominis Bracing  - 10 x daily - 7 x weekly - 1 sets - 10 reps - 3-5 seconds  hold - Sit to Stand  - 3 x daily - 7 x weekly - 1 sets - 5 reps - Supine Bridge  - 2 x daily - 7 x weekly - 1 sets - 10 reps - 3-5 seconds hold  ASSESSMENT:  CLINICAL IMPRESSION:   Session focus with core stability exercises and proximal strengthening.  Pt continues to present with hypermobility and significant core weakness.  Educated on importance of core stability to support back for pain control.  Progressed gluteal and core strengthening with additional exercises, monitor pain through session.  Discussed importance of walking program, encouraged to begin daily and increase time as able.  No reports of increased pain through session.   OBJECTIVE IMPAIRMENTS decreased activity tolerance,  decreased balance, difficulty walking, decreased ROM, decreased strength, and pain.   ACTIVITY LIMITATIONS carrying, lifting, bending, sitting, standing, squatting, bed mobility, dressing, and locomotion level  PARTICIPATION LIMITATIONS: cleaning, laundry, driving, shopping, community activity, and occupation  PERSONAL FACTORS Behavior pattern, Fitness, Past/current experiences, and Time since onset of injury/illness/exacerbation are also affecting patient's functional outcome.   REHAB POTENTIAL: Good  CLINICAL DECISION MAKING: Stable/uncomplicated  EVALUATION COMPLEXITY: Moderate   GOALS: Goals reviewed with patient? No  SHORT TERM GOALS: Target date: 06/19/2022  PT I in HEP to decrease her pain to no greater than a 6/10 to allow pt to be able to walk for 10 minutes at a time.  Baseline: Goal status: IN PROGRESS  2.  Pt mm strength to be increased 1/2 grade to be able to come sit to stand 9x in  30 seconds  Baseline: 06/03/22 11 times in 30 sit  to stand Goal status: met  3.  PT to be able to single leg stance for 15 seconds B to demonstrate improved core strength  Baseline:  Goal status: IN PROGRESS    LONG TERM GOALS: Target date: 07/10/2022  Pt to be I in an advanced HEP to decrease her pain to no greater than a 3/10 to allow pt to be able to walk without an assistive device Baseline:  Goal status: IN PROGRESS  2.  Pt to have no radicular sx Baseline:  Goal status: IN PROGRESS  3.  PT ROM to be functional to be able to pick items off the floor without increased pain.  Baseline:  Goal status: IN PROGRESS  4.  PT strength to be increased by one grade to be able to  come sit to stand x 12 reps in a 30 second time period.  Baseline:  Goal status: IN PROGRESS  5.  PT to be able to stand/walk for 30 minutes at a time for housework and shopping tasks.  Baseline:  Goal status: IN PROGRESS  6.  PT to be able to single leg stance B for 30 seconds for improved safety  walking on gravel/grass.  Baseline:  Goal status: IN PROGRESS   PLAN: PT FREQUENCY: 2x/week  PT DURATION: 6 weeks  PLANNED INTERVENTIONS: Therapeutic exercises, Therapeutic activity, Balance training, Gait training, Patient/Family education, Self Care, and Manual therapy.  PLAN FOR NEXT SESSION: continue with lumbar stability program.  Begin seated exercise ball exercises and progress standing tolerance.    Ihor Austin, LPTA/CLT; CBIS [phone]  4:54 PM, 07/03/22

## 2022-07-08 ENCOUNTER — Ambulatory Visit (HOSPITAL_COMMUNITY): Payer: 59

## 2022-07-08 ENCOUNTER — Encounter (HOSPITAL_COMMUNITY): Payer: Self-pay

## 2022-07-08 DIAGNOSIS — M6281 Muscle weakness (generalized): Secondary | ICD-10-CM

## 2022-07-08 DIAGNOSIS — R262 Difficulty in walking, not elsewhere classified: Secondary | ICD-10-CM | POA: Diagnosis not present

## 2022-07-08 DIAGNOSIS — M545 Low back pain, unspecified: Secondary | ICD-10-CM

## 2022-07-08 DIAGNOSIS — M5416 Radiculopathy, lumbar region: Secondary | ICD-10-CM

## 2022-07-08 NOTE — Therapy (Signed)
OUTPATIENT PHYSICAL THERAPY THORACOLUMBAR TREATMENT   Patient Name: Paula Kane MRN: [id number] DOB:1991/02/04, 31 y.o., female Today's Date: 07/08/2022   PT End of Session - 07/08/22 1607     Visit Number 8    Number of Visits 12    Date for PT Re-Evaluation 07/07/22    Authorization Type [employer]    Progress Note Due on Visit 10    PT Start Time 1604    PT Stop Time 1643    PT Time Calculation (min) 39 min    Activity Tolerance Patient tolerated treatment well    Behavior During Therapy [facility] for tasks assessed/performed                   Past Medical History:  Diagnosis Date   Anxiety    Arrhythmia    Asthma    Bipolar disorder ([place])    Depression    Headache(784.0)    History of borderline personality disorder    Menorrhagia    Migraine headache with aura 11/17/2012   Associated with menses    Obsessive-compulsive disorder    Oppositional defiant disorder    Psychosis ([place])    schizoaffective disorder   PTSD (post-traumatic stress disorder)    Past Surgical History:  Procedure Laterality Date   DORSAL COMPARTMENT RELEASE Right 12/18/2020   Procedure: RIGHT HAND DEQUERVAIN'S RELEASE;  Surgeon: Carole Civil, MD;  Location: [facility];  Service: Orthopedics;  Laterality: Right;   Patient Active Problem List   Diagnosis Date Noted   Back pain 04/16/2022   Harriet Pho disease (radial styloid tenosynovitis)    Has a tremor 12/15/2012   Nausea alone 12/14/2012   Excessive menses 11/17/2012   Menses painful 11/17/2012   Migraine headache with aura 11/17/2012   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 Arther Abbott Next apt 07/17/22 REFERRING DIAG:  M54.50 (ICD-10-CM) - Lumbar pain  Rationale for Evaluation and Treatment Rehabilitation  THERAPY DIAG:  Difficulty in walking, lumbar radiculopathy, mm weakness  ONSET DATE: chronic   SUBJECTIVE:                                                                                                                                                                                           SUBJECTIVE STATEMENT: Pt reports she assisted with cleansing dog for the  first time she's ever done due to bending over.  She even sat in floor and dried dog.  Pain scale 5/10 mainly tightness and little soreness.  Has began some walking progress and ability to walk a little further.  Accompanied with fiance this session.   Feels she has improved by 10%.  Has purchased theraball and began seated exercises on, trying to sit up without support and increased awareness with posture and doing abdominal sets.  PERTINENT HISTORY:  31 year old female presents with a 10-year history of back pain worse over the last year. She says now it is hard for her to walk and she cannot stand for long periods of time. The pain starts in her lower back and radiates down her left leg.  PAIN:  Are you having pain? Yes: :"NPRS scale: 6/10 worst is an 8-9," Pain location: low back  " Pain description: sore and throbbing  Aggravating factors: walking ,"Relieving factors: self medicate   PRECAUTIONS: None  WEIGHT BEARING RESTRICTIONS No  FALLS:  Has patient fallen in last 6 months? No  LIVING ENVIRONMENT: Lives with: lives with their family Lives in: House/apartment Stairs: Yes: External: 40 steps; on right going up Has following equipment at home: Single point cane and Wheelchair (manual)  OCCUPATION: Pt works from home   PLOF: Independent with household mobility with device  PATIENT GOALS less pain    OBJECTIVE:   DIAGNOSTIC FINDINGS:  X-rays are (-)  PATIENT SURVEYS:  FOTO  29  MUSCLE LENGTH: Hamstrings: Right 155 deg; Left 160 deg POSTURE: rounded shoulders, forward head, increased lumbar lordosis, and decreased thoracic kyphosis  LUMBAR ROM:   Active  A/PROM  eval AROM 07/08/22  Flexion Fingers to mid calf, increased pain both going down and pulling up  Fingers to mid calf, increased pain both going down and pulling up  Extension Normal  reps improve sx  Normal  reps improve sx  Right lateral flexion    Left lateral flexion    Right rotation    Left rotation     (Blank rows = not tested)   LOWER EXTREMITY MMT:    MMT Right eval Left eval Right  07/08/22 Left  07/08/22  Hip flexion 3+/5 4-/5 4/5 4/5  Hip extension 3-/5 3/5 3+/5 3+  Hip abduction 4/5 4/5 4+ 4+  Hip adduction      Hip internal rotation      Hip external rotation      Knee flexion 3+/5 3+/5 5/5 5/5  Knee extension 4+/5 3-/5 5/5 4/5  Ankle dorsiflexion 3/5 3+/5    Ankle plantarflexion      Ankle inversion      Ankle eversion       (Blank rows = not tested)    FUNCTIONAL TESTS:  Eval: 30 seconds chair stand test:  6 2 minute walk test: 175' with cane  Single leg stance:  RT:  15", Lt:  6"  07/08/22:  30 seconds chair stand test: 13 2MWT: 340f with SPC Single leg stance: Rt 18, Lt 11"     TODAY'S TREATMENT  07/08/22: Reviewed goals FOTO was 29.42, 07/08/22: 29.6 MMT see above 30 seconds chair stand test: 13 2MWT: 3438fwith SPC Single leg stance: Rt 18, Lt 11"  Discussed continue vs DC from PT  Educated activities to complete on theraball.   07/03/22 Standing: 3D Hip excursion x 10  Shoulder extension 15x  Paloff 15x each side Supine: dead bug with ab set  Clam with ab set and  GTB for stability Sitting: Paloff with no LE support Prone: hip extension 10x  Press up 10x 10" 06/26/22 Standing: Hip excursion x 3  Rows x 10 green theraband  Scapular retraction x 10 green theraband  Sitting :  sit to stand Supine:  dead bug x 10                 Knee fall out x 10 B                LTR x 10                SLR x 10 B  Sidelying :  hip abduction x 10 B Prone :       Press up x 5                    Combination glut/heel squeeze x 10  06/19/22: Quadruped:  Cat/camel 8 reps x10" holds Child's pose 2x 30" 1x neutral; 1x to the Rt for lats stretch Prone hip extension 2 setsx 5 reps  Supine:  LTR 5 10" Bridge DC due to reports of increased radicular symptoms from back to anterior groin Dead bug with ab set  Sidelying: Abd 15x  Standing: 3D Hip excursion (weight shifting with arm stretch, rotation, STS, lumbar extension pain free range.)   Supine:  LTR x 10 SKTC with towel 20" hold x 5 each Active hamstring stretch with towel 20" hold x 5 each     05/26/2022:  Evaluation; Sitting:  abdominal set x 10              Sit to stand x 5 Supine:  bridge x 10    PATIENT EDUCATION:  Education details: HEP Person educated: Patient Education method: Explanation Education comprehension: verbalized understanding and returned demonstration   HOME EXERCISE PROGRAM 07/08/22 Seated on theraball: Palloff with theraband, marching, ab sets, STS.  06/26/22 Access Code: [email or web address] URL: [email or web address] Date: 06/26/2022 Prepared by: Rayetta Humphrey  Exercises - Dead Bug  - 1 x daily - 7 x weekly - 1 sets - 10 reps - 3" hold - Bent Knee Fallouts  - 1 x daily - 7 x weekly - 1 sets - 10 reps - 3-5" hold - Standing Shoulder Extension with Resistance  - 1 x daily - 7 x weekly - 1 sets - 10 reps - 3-5" hold - Standing Shoulder Row with Anchored Resistance  - 1 x daily - 7 x weekly - 1 sets - 10 reps - 3-5" hold 06/03/22  supine hip adduction with ball; supine hip abduction with GTB 05/29/22  SK to chest, hamstring stretch, LTR [email or web address] Code: [email or web address] URL: [email or web address] Date: 05/26/2022 Prepared by: Rayetta Humphrey  Exercises - Seated Transversus Abdominis Bracing  - 10 x daily - 7 x  weekly - 1 sets - 10 reps - 3-5 seconds  hold - Sit to Stand  - 3 x daily - 7 x weekly - 1 sets - 5 reps - Supine Bridge  - 2 x daily - 7 x weekly - 1 sets - 10 reps - 3-5 seconds hold  ASSESSMENT:  CLINICAL IMPRESSION:   Reviewed goals per cert date.  Pt has made improvements with LE strengthening, improved with 30 second STS, SLS, and improved tolerance for standing/walk up to 3-4 minutes prior increased pain.  FOTO score improved by .18% though reports increased abilities for functional abilities at home including cleansing dog and ability to stand from floor independently.  Pt  continues to be limited by pain and decreased activity tolerance.  Pt wishes to receive MRI prior continuing PT.  Continues to demonstrate core weakness, pt educated on importance of continuing home exercises with focus on core strength for lumbar support, verbalized understanding and stated she will continue with theraball seated exercises, instructed further core strengthening.     OBJECTIVE IMPAIRMENTS decreased activity tolerance, decreased balance, difficulty walking, decreased ROM, decreased strength, and pain.   ACTIVITY LIMITATIONS carrying, lifting, bending, sitting, standing, squatting, bed mobility, dressing, and locomotion level  PARTICIPATION LIMITATIONS: cleaning, laundry, driving, shopping, community activity, and occupation  PERSONAL FACTORS Behavior pattern, Fitness, Past/current experiences, and Time since onset of injury/illness/exacerbation are also affecting patient's functional outcome.   REHAB POTENTIAL: Good  CLINICAL DECISION MAKING: Stable/uncomplicated  EVALUATION COMPLEXITY: Moderate   GOALS: Goals reviewed with patient? No  SHORT TERM GOALS: Target date: 06/19/2022  PT I in HEP to decrease her pain to no greater than a 6/10 to allow pt to be able to walk for 10 minutes at a time.  Baseline:  07/08/22:  Reports ability to walk for 3-4 minutes with pain scale 5-6/10. Goal status:  IN PROGRESS  2.  Pt mm strength to be increased 1/2 grade to be able to come sit to stand 9x in 30 seconds  Baseline: 06/03/22 11 times in 30 sit  to stand; 07/08/22 13 times in 30" STS Goal status: met  3.  PT to be able to single leg stance for 15 seconds B to demonstrate improved core strength  Baseline: 07/08/22: Rt 18, Lt 11" Goal status: IN PROGRESS    LONG TERM GOALS: Target date: 07/10/2022  Pt to be I in an advanced HEP to decrease her pain to no greater than a 3/10 to allow pt to be able to walk without an assistive device Baseline: 07/08/22:  Reports compliance with advanced HEP, continues to walk with cane with pain scale 5-6/10 Goal status: IN PROGRESS  2.  Pt to have no radicular sx Baseline: 07/08/22: Current radicular symptoms down to knee, reports feet no longer go numb since began therapy.  Stated radicular symptoms do go further with activity.   Goal status: IN PROGRESS  3.  PT ROM to be functional to be able to pick items off the floor without increased pain.  Baseline: 07/08/22:   continues to have pain with forward flexion Goal status: IN PROGRESS  4.  PT strength to be increased by one grade to be able to  come sit to stand x 12 reps in a 30 second time period.  Baseline: see above.  30" STS 13x no HHA. Goal status: IN PROGRESS  5.  PT to be able to stand/walk for 30 minutes at a time for housework and shopping tasks.  Baseline: 07/08/22:  Able to stand 3-4 minutes, reports increased pain standing Goal status: IN PROGRESS  6.  PT to be able to single leg stance B for 30 seconds for improved safety walking on gravel/grass.  Baseline: 07/08/22: Rt 18, Lt 11" Goal status: IN PROGRESS   PLAN: PT FREQUENCY: 2x/week  PT DURATION: 6 weeks  PLANNED INTERVENTIONS: Therapeutic exercises, Therapeutic activity, Balance training, Gait training, Patient/Family education, Self Care, and Manual therapy.  PLAN FOR NEXT SESSION: Pt wishes to DC from PT until MRI then  will return per advised with MD.    Ihor Austin, LPTA/CLT; CBIS [phone]  5:58 PM, 07/08/22

## 2022-07-10 ENCOUNTER — Encounter (HOSPITAL_COMMUNITY): Payer: 59

## 2022-07-15 ENCOUNTER — Encounter (HOSPITAL_COMMUNITY): Payer: 59

## 2022-07-17 ENCOUNTER — Ambulatory Visit: Payer: 59 | Admitting: Orthopedic Surgery

## 2022-10-20 DIAGNOSIS — F250 Schizoaffective disorder, bipolar type: Secondary | ICD-10-CM | POA: Diagnosis not present

## 2022-10-20 DIAGNOSIS — F4310 Post-traumatic stress disorder, unspecified: Secondary | ICD-10-CM | POA: Diagnosis not present

## 2022-10-20 DIAGNOSIS — F429 Obsessive-compulsive disorder, unspecified: Secondary | ICD-10-CM | POA: Diagnosis not present

## 2022-10-30 ENCOUNTER — Encounter: Payer: Self-pay | Admitting: Radiology

## 2023-01-20 DIAGNOSIS — F250 Schizoaffective disorder, bipolar type: Secondary | ICD-10-CM | POA: Diagnosis not present

## 2023-01-31 ENCOUNTER — Encounter (HOSPITAL_COMMUNITY): Payer: Self-pay | Admitting: Emergency Medicine

## 2023-01-31 ENCOUNTER — Other Ambulatory Visit: Payer: Self-pay

## 2023-01-31 ENCOUNTER — Emergency Department (HOSPITAL_COMMUNITY)
Admission: EM | Admit: 2023-01-31 | Discharge: 2023-01-31 | Disposition: A | Discharge status: Home / Self Care | Payer: 59 | Attending: Emergency Medicine | Admitting: Emergency Medicine

## 2023-01-31 DIAGNOSIS — H6092 Unspecified otitis externa, left ear: Secondary | ICD-10-CM | POA: Diagnosis not present

## 2023-01-31 DIAGNOSIS — R030 Elevated blood-pressure reading, without diagnosis of hypertension: Secondary | ICD-10-CM | POA: Insufficient documentation

## 2023-01-31 DIAGNOSIS — H60502 Unspecified acute noninfective otitis externa, left ear: Secondary | ICD-10-CM | POA: Diagnosis not present

## 2023-01-31 DIAGNOSIS — H9202 Otalgia, left ear: Secondary | ICD-10-CM

## 2023-01-31 MED ORDER — CIPROFLOXACIN-DEXAMETHASONE 0.3-0.1 % OT SUSP
4.0000 [drp] | Freq: Two times a day (BID) | OTIC | Status: DC
  Administered 2023-01-31: 4 [drp] via OTIC
  Filled 2023-01-31: qty 7.5

## 2023-01-31 NOTE — ED Notes (Signed)
ED Provider at bedside. 

## 2023-01-31 NOTE — Discharge Instructions (Signed)
Use the antibiotic drops twice a day for a week, follow up with ENT for further evaluation. Monitor your blood pressure at home and follow up with your PCP if remains elevated

## 2023-01-31 NOTE — ED Triage Notes (Signed)
Pt states that she has been "hearing a pulse" out of her left ear x 6 months but that recently she has noticed that her hearing in L ear is diminished and she is having pain in that ear now.

## 2023-01-31 NOTE — ED Provider Notes (Signed)
[place] EMERGENCY DEPARTMENT AT [facility]  Provider Note  CSN: [id number] Arrival date & time: 01/31/23 2203  History Chief Complaint  Patient presents with   Otalgia    Paula Kane is a 32 y.o. female who reports about 6 months of hearing her pulse like 'wooshing' in her ears bilaterally. She did not seek medical attention but in the last 2 weeks she has begun to have discomfort and decreased hearing in her L ear. She was noted to be hypertensive in triage here, but reports she checked her BP at home regularly and it is typically normal. She does not have a diagnosis of HTN. No fevers, no drainage from ears.    Home Medications Prior to Admission medications   Medication Sig Start Date End Date Taking? Authorizing Provider  busPIRone (BUSPAR) 15 MG tablet Take 15 mg by mouth 2 (two) times daily.    [provider]  cetirizine (ZYRTEC) 10 MG tablet Take 10 mg by mouth in the morning.    [provider]  fexofenadine (ALLEGRA) 180 MG tablet Take 180 mg by mouth every evening.    [provider]  ibuprofen (ADVIL) 800 MG tablet Take 1 tablet (800 mg total) by mouth every 8 (eight) hours as needed. 03/30/22   Bethann Berkshire, MD  ibuprofen (ADVIL) 800 MG tablet Take 1 tablet (800 mg total) by mouth every 8 (eight) hours as needed. Patient not taking: Reported on 04/17/2022 03/30/22   Bethann Berkshire, MD  Melatonin 10 MG TABS Take 10 mg by mouth at bedtime.    [provider]  Multiple Vitamin (MULTIVITAMIN) tablet Take 1 tablet by mouth daily.    [provider]  norethindrone (MICRONOR) 0.35 MG tablet Take 1 tablet by mouth daily.    [provider]  prazosin (MINIPRESS) 2 MG capsule Take 2 mg by mouth at bedtime.    [provider]  QUEtiapine (SEROQUEL) 300 MG tablet Take 300 mg by mouth at bedtime.    [provider]  sertraline (ZOLOFT) 100 MG tablet Take 200 mg by mouth daily.    [provider]  calcium-vitamin D (OSCAL WITH D) 250-125 MG-UNIT per tablet Take 1 tablet by mouth 2 (two) times daily.  12/08/19  [provider]  lithium 300 MG tablet Take 2 tab twice a day 08/22/13 12/08/19  Myrlene Broker, MD  propranolol (INDERAL) 10 MG tablet Take 1 tablet (10 mg total) by mouth 3 (three) times daily. 08/22/13 12/08/19  Myrlene Broker, MD     Allergies    Hydrocodone, Sulfa antibiotics, Codeine, Lactose intolerance (gi), Lexapro [escitalopram oxalate], Soap, Prednisone, and Adhesive [tape]   Review of Systems   Review of Systems Please see HPI for pertinent positives and negatives  Physical Exam BP (!) 160/94 (BP Location: Left Arm)   Pulse 95   Temp 98.3 F (36.8 C) (Oral)   Resp 18   Ht 5\' 2"  (1.575 m)   Wt 136.1 kg   SpO2 95%   BMI 54.87 kg/m   Physical Exam Vitals and nursing note reviewed.  HENT:     Head: Normocephalic.     Right Ear: Tympanic membrane and ear canal normal.     Left Ear: Tympanic membrane normal.     Ears:     Comments: L canal is narrow, mild edema, no erythema    Nose: Nose normal.  Eyes:     Extraocular Movements: Extraocular movements intact.  Pulmonary:  Effort: Pulmonary effort is normal.  Musculoskeletal:        General: Normal range of motion.     Cervical back: Neck supple.  Skin:    Findings: No rash (on exposed skin).  Neurological:     Mental Status: She is alert and oriented to person, place, and time.  Psychiatric:        Mood and Affect: Mood normal.     ED Results / Procedures / Treatments   EKG None  Procedures Procedures  Medications Ordered in the ED Medications  ciprofloxacin-dexamethasone (CIPRODEX) 0.3-0.1 % OTIC (EAR) suspension 4 drop (has no administration in time range)    Initial Impression and Plan  Patient here with several months of hearing her pulse in her ear. Noted to be hypertensive here but she reports home BP is typically well controlled. Her more acute concern  is pain and decreased hearing in L ear. TM appears normal, but canal is narrow and possibly a mild or early otitis externa. Will give a course of Abx drops to see if that helps. She will continue to monitor BP at home. Follow up with PCP if BP remains elevated. Follow up with ENT for further evaluation of her otalgia.   ED Course       MDM Rules/Calculators/A&P Medical Decision Making    Final Clinical Impression(s) / ED Diagnoses Final diagnoses:  Left ear pain  Acute otitis externa of left ear, unspecified type  Elevated blood pressure reading    Rx / DC Orders ED Discharge Orders     None        Pollyann Savoy, MD 01/31/23 2326

## 2023-06-11 DIAGNOSIS — F250 Schizoaffective disorder, bipolar type: Secondary | ICD-10-CM | POA: Diagnosis not present

## 2023-06-11 DIAGNOSIS — F50819 Binge eating disorder, unspecified: Secondary | ICD-10-CM | POA: Diagnosis not present

## 2023-06-11 DIAGNOSIS — F88 Other disorders of psychological development: Secondary | ICD-10-CM | POA: Diagnosis not present

## 2023-06-11 DIAGNOSIS — F4310 Post-traumatic stress disorder, unspecified: Secondary | ICD-10-CM | POA: Diagnosis not present

## 2023-10-13 DIAGNOSIS — F429 Obsessive-compulsive disorder, unspecified: Secondary | ICD-10-CM | POA: Diagnosis not present

## 2023-10-13 DIAGNOSIS — Z6841 Body Mass Index (BMI) 40.0 and over, adult: Secondary | ICD-10-CM | POA: Diagnosis not present

## 2023-10-13 DIAGNOSIS — Z760 Encounter for issue of repeat prescription: Secondary | ICD-10-CM | POA: Diagnosis not present

## 2023-10-13 DIAGNOSIS — F411 Generalized anxiety disorder: Secondary | ICD-10-CM | POA: Diagnosis not present

## 2023-10-13 DIAGNOSIS — F4310 Post-traumatic stress disorder, unspecified: Secondary | ICD-10-CM | POA: Diagnosis not present

## 2023-12-18 ENCOUNTER — Other Ambulatory Visit: Payer: Self-pay

## 2023-12-18 ENCOUNTER — Emergency Department (HOSPITAL_COMMUNITY)
Admission: EM | Admit: 2023-12-18 | Discharge: 2023-12-18 | Disposition: A | Discharge status: Home / Self Care | Attending: Emergency Medicine | Admitting: Emergency Medicine

## 2023-12-18 ENCOUNTER — Encounter (HOSPITAL_COMMUNITY): Payer: Self-pay

## 2023-12-18 DIAGNOSIS — R300 Dysuria: Secondary | ICD-10-CM | POA: Diagnosis not present

## 2023-12-18 DIAGNOSIS — R1030 Lower abdominal pain, unspecified: Secondary | ICD-10-CM | POA: Insufficient documentation

## 2023-12-18 DIAGNOSIS — R319 Hematuria, unspecified: Secondary | ICD-10-CM | POA: Diagnosis present

## 2023-12-18 DIAGNOSIS — N390 Urinary tract infection, site not specified: Secondary | ICD-10-CM

## 2023-12-18 LAB — URINALYSIS, ROUTINE W REFLEX MICROSCOPIC
Bacteria, UA: NONE SEEN
Bilirubin Urine: NEGATIVE
Glucose, UA: NEGATIVE mg/dL
Hgb urine dipstick: NEGATIVE
Ketones, ur: 5 mg/dL — AB
Nitrite: NEGATIVE
Protein, ur: 30 mg/dL — AB
Specific Gravity, Urine: 1.031 — ABNORMAL HIGH (ref 1.005–1.030)
pH: 6 (ref 5.0–8.0)

## 2023-12-18 LAB — PREGNANCY, URINE: Preg Test, Ur: NEGATIVE

## 2023-12-18 MED ORDER — CEPHALEXIN 500 MG PO CAPS
500.0000 mg | ORAL_CAPSULE | Freq: Once | ORAL | Status: AC
  Administered 2023-12-18: 500 mg via ORAL
  Filled 2023-12-18 (×2): qty 1

## 2023-12-18 MED ORDER — ONDANSETRON 4 MG PO TBDP
4.0000 mg | ORAL_TABLET | Freq: Once | ORAL | Status: AC
  Administered 2023-12-18: 4 mg via ORAL
  Filled 2023-12-18: qty 1

## 2023-12-18 MED ORDER — PHENAZOPYRIDINE HCL 100 MG PO TABS
200.0000 mg | ORAL_TABLET | Freq: Once | ORAL | Status: AC
  Administered 2023-12-18: 200 mg via ORAL
  Filled 2023-12-18: qty 2

## 2023-12-18 MED ORDER — CEPHALEXIN 500 MG PO CAPS: 500.0000 mg | ORAL_CAPSULE | Freq: Two times a day (BID) | ORAL | 0 refills | Status: AC

## 2023-12-18 NOTE — ED Provider Notes (Signed)
 [place] EMERGENCY DEPARTMENT AT [facility] Provider Note   CSN: [id number] Arrival date & time: 12/18/23  2008     History  Chief Complaint  Patient presents with   Dysuria    Paula Kane is a 33 y.o. female. Hx PTSD and frequent UTIs, here for about a month of dysuria and frequency. Today she started having blood in the urine, and suprapubic pain, no fever or flank pain.   Dysuria      Home Medications Prior to Admission medications   Medication Sig Start Date End Date Taking? Authorizing Provider  busPIRone (BUSPAR) 15 MG tablet Take 15 mg by mouth 2 (two) times daily.    [provider]  cetirizine (ZYRTEC) 10 MG tablet Take 10 mg by mouth in the morning.    [provider]  fexofenadine (ALLEGRA) 180 MG tablet Take 180 mg by mouth every evening.    [provider]  ibuprofen  (ADVIL ) 800 MG tablet Take 1 tablet (800 mg total) by mouth every 8 (eight) hours as needed. 03/30/22   Zammit, Joseph, MD  ibuprofen  (ADVIL ) 800 MG tablet Take 1 tablet (800 mg total) by mouth every 8 (eight) hours as needed. Patient not taking: Reported on 04/17/2022 03/30/22   Zammit, Joseph, MD  Melatonin 10 MG TABS Take 10 mg by mouth at bedtime.    [provider]  Multiple Vitamin (MULTIVITAMIN) tablet Take 1 tablet by mouth daily.    [provider]  norethindrone  (MICRONOR ) 0.35 MG tablet Take 1 tablet by mouth daily.    [provider]  prazosin (MINIPRESS) 2 MG capsule Take 2 mg by mouth at bedtime.    [provider]  QUEtiapine  (SEROQUEL ) 300 MG tablet Take 300 mg by mouth at bedtime.    [provider]  sertraline  (ZOLOFT ) 100 MG tablet Take 200 mg by mouth daily.    [provider]  calcium-vitamin D (OSCAL WITH D) 250-125 MG-UNIT per tablet Take 1 tablet by mouth 2 (two) times daily.  12/08/19  [provider]  lithium  300 MG tablet Take 2 tab twice a day 08/22/13 12/08/19  Alysia Bachelor, MD  propranolol  (INDERAL ) 10 MG tablet Take 1 tablet (10 mg total) by mouth 3 (three) times daily. 08/22/13 12/08/19  Alysia Bachelor, MD      Allergies    Hydrocodone, Sulfa antibiotics, Codeine, Lactose intolerance (gi), Lexapro [escitalopram oxalate], Soap, Prednisone , and Adhesive [tape]    Review of Systems   Review of Systems  Genitourinary:  Positive for dysuria.    Physical Exam Updated Vital Signs BP (!) 178/104 (BP Location: Right Arm)   Pulse 91   Temp 98.5 F (36.9 C) (Oral)   Resp 16   Ht 5\' 2"  (1.575 m)   Wt 125.2 kg   SpO2 100%   BMI 50.48 kg/m  Physical Exam Vitals and nursing note reviewed.  Constitutional:      General: She is not in acute distress.    Appearance: She is well-developed.  HENT:     Head: Normocephalic and atraumatic.  Eyes:     Conjunctiva/sclera: Conjunctivae normal.  Cardiovascular:     Rate and Rhythm: Normal rate and regular rhythm.     Heart sounds: No murmur heard. Pulmonary:     Effort: Pulmonary effort is normal. No respiratory distress.     Breath sounds: Normal breath sounds.  Abdominal:     Palpations: Abdomen is soft.     Tenderness: There  is abdominal tenderness in the suprapubic area. There is no right CVA tenderness, left CVA tenderness, guarding or rebound.  Musculoskeletal:        General: No swelling.     Cervical back: Neck supple.  Skin:    General: Skin is warm and dry.     Capillary Refill: Capillary refill takes less than 2 seconds.  Neurological:     Mental Status: She is alert.  Psychiatric:        Mood and Affect: Mood normal.     ED Results / Procedures / Treatments   Labs (all labs ordered are listed, but only abnormal results are displayed) Labs Reviewed  URINALYSIS, ROUTINE W REFLEX MICROSCOPIC  PREGNANCY, URINE    EKG None  Radiology No results found.  Procedures Procedures    Medications Ordered in ED Medications  phenazopyridine  (PYRIDIUM ) tablet 200 mg (has no  administration in time range)    ED Course/ Medical Decision Making/ A&P                                 Medical Decision Making Ddx: UTI, vaginitis, pregnancy, other  ED course: Patient presents with dysuria, hematuria and suprapubic pain that feels exactly her prior UTIs.  She having some mild symptoms for about a month but today got worse with frequency urgency and some blood in her urine.  She denies any vaginal symptoms, denies any chance of pregnancy.  Urinalysis shows moderate leukocytes, she has 10 white blood cells no bacteria and 6-10 squamous epithelial cells.  Given symptoms consistent with prior UTIs we will treat with antibiotics.  She has no flank pain or fevers to suggest pyelonephritis.  She is not having any vomiting.  She is nontoxic in appearance.  She is not pregnant.  She was given a dose of Pyridium  and Zofran  here for symptoms.  She was advised follow-up and strict return precautions.  Amount and/or Complexity of Data Reviewed External Data Reviewed: notes. Labs: ordered. Decision-making details documented in ED Course.  Risk Prescription drug management.           Final Clinical Impression(s) / ED Diagnoses Final diagnoses:  None    Rx / DC Orders ED Discharge Orders     None         Aimee Houseman, PA-C 12/18/23 2331    Teddi Favors, DO 12/22/23 0715

## 2023-12-18 NOTE — ED Triage Notes (Signed)
 Pt with burning during urination and foul smelling urine for several weeks.

## 2023-12-18 NOTE — Discharge Instructions (Addendum)
 You were seen today for burning with urination, urinalysis shows some leukocytes and white blood cells but otherwise is reassuring, given your symptoms we will still treat with some antibiotics but you should follow-up with your PCP for culture results, if there is no growth they may have you stop the antibiotics.  Come back to the ER if you have new or worsening symptoms especially if fever, back or abdominal pain or vomiting.  Blood pressure was elevated today, follow-up with your PCP for recheck.

## 2023-12-20 LAB — URINE CULTURE: Culture: NO GROWTH
